# Patient Record
Sex: Male | Born: 1990 | Race: White | Hispanic: No | Marital: Single | Smoking: Current every day smoker
Health system: Southern US, Community
[De-identification: ages and names within clinical notes are randomized; demographics above are authoritative.]

## PROBLEM LIST (undated history)

## (undated) DIAGNOSIS — T4145XA Adverse effect of unspecified anesthetic, initial encounter: Secondary | ICD-10-CM

## (undated) DIAGNOSIS — T8859XA Other complications of anesthesia, initial encounter: Secondary | ICD-10-CM

## (undated) HISTORY — PX: CLAVICLE SURGERY: SHX598

## (undated) HISTORY — PX: HERNIA REPAIR: SHX51

## (undated) HISTORY — PX: APPENDECTOMY: SHX54

---
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
## 2019-03-04 ENCOUNTER — Other Ambulatory Visit: Payer: Self-pay

## 2019-03-04 ENCOUNTER — Emergency Department: Payer: Self-pay

## 2019-03-04 ENCOUNTER — Inpatient Hospital Stay
Admission: EM | Admit: 2019-03-04 | Discharge: 2019-03-08 | DRG: 571 | Disposition: A | Discharge status: Home / Self Care | Payer: Self-pay | Attending: Internal Medicine | Admitting: Internal Medicine

## 2019-03-04 DIAGNOSIS — Z09 Encounter for follow-up examination after completed treatment for conditions other than malignant neoplasm: Secondary | ICD-10-CM

## 2019-03-04 DIAGNOSIS — Z20828 Contact with and (suspected) exposure to other viral communicable diseases: Secondary | ICD-10-CM | POA: Diagnosis present

## 2019-03-04 DIAGNOSIS — F17200 Nicotine dependence, unspecified, uncomplicated: Secondary | ICD-10-CM | POA: Diagnosis present

## 2019-03-04 DIAGNOSIS — L02612 Cutaneous abscess of left foot: Secondary | ICD-10-CM | POA: Diagnosis present

## 2019-03-04 DIAGNOSIS — L03116 Cellulitis of left lower limb: Principal | ICD-10-CM | POA: Diagnosis present

## 2019-03-04 DIAGNOSIS — R51 Headache: Secondary | ICD-10-CM | POA: Diagnosis present

## 2019-03-04 DIAGNOSIS — Z8614 Personal history of Methicillin resistant Staphylococcus aureus infection: Secondary | ICD-10-CM

## 2019-03-04 DIAGNOSIS — L03032 Cellulitis of left toe: Secondary | ICD-10-CM | POA: Diagnosis present

## 2019-03-04 HISTORY — DX: Other complications of anesthesia, initial encounter: T88.59XA

## 2019-03-04 LAB — COMPREHENSIVE METABOLIC PANEL
ALT: 25 U/L (ref 0–44)
AST: 20 U/L (ref 15–41)
Albumin: 3.8 g/dL (ref 3.5–5.0)
Alkaline Phosphatase: 81 U/L (ref 38–126)
Anion gap: 7 (ref 5–15)
BUN: 12 mg/dL (ref 6–20)
CO2: 27 mmol/L (ref 22–32)
Calcium: 9.3 mg/dL (ref 8.9–10.3)
Chloride: 105 mmol/L (ref 98–111)
Creatinine, Ser: 0.92 mg/dL (ref 0.61–1.24)
GFR calc Af Amer: >60 mL/min (ref >60)
GFR calc non Af Amer: >60 mL/min (ref >60)
Glucose, Bld: 114 mg/dL — ABNORMAL HIGH (ref 70–99)
Potassium: 4.1 mmol/L (ref 3.5–5.1)
Sodium: 139 mmol/L (ref 135–145)
Total Bilirubin: 0.6 mg/dL (ref 0.3–1.2)
Total Protein: 7.2 g/dL (ref 6.5–8.1)

## 2019-03-04 LAB — CBC WITH DIFFERENTIAL/PLATELET
Abs Immature Granulocytes: 0.06 10*3/uL (ref 0.00–0.07)
Basophils Absolute: 0.1 10*3/uL (ref 0.0–0.1)
Basophils Relative: 1 %
Eosinophils Absolute: 0.2 10*3/uL (ref 0.0–0.5)
Eosinophils Relative: 2 %
HCT: 40.3 % (ref 39.0–52.0)
Hemoglobin: 13.8 g/dL (ref 13.0–17.0)
Immature Granulocytes: 1 %
Lymphocytes Relative: 16 %
Lymphs Abs: 1.7 10*3/uL (ref 0.7–4.0)
MCH: 30.3 pg (ref 26.0–34.0)
MCHC: 34.2 g/dL (ref 30.0–36.0)
MCV: 88.4 fL (ref 80.0–100.0)
Monocytes Absolute: 1 10*3/uL (ref 0.1–1.0)
Monocytes Relative: 9 %
Neutro Abs: 7.8 10*3/uL — ABNORMAL HIGH (ref 1.7–7.7)
Neutrophils Relative %: 71 %
Platelets: 210 10*3/uL (ref 150–400)
RBC: 4.56 MIL/uL (ref 4.22–5.81)
RDW: 12.5 % (ref 11.5–15.5)
WBC: 10.7 10*3/uL — ABNORMAL HIGH (ref 4.0–10.5)

## 2019-03-04 LAB — LACTIC ACID, PLASMA: Lactic Acid, Venous: 1.3 mmol/L (ref 0.5–1.9)

## 2019-03-04 LAB — SEDIMENTATION RATE: Sed Rate: 12 mm/hr (ref 0–15)

## 2019-03-04 MED ORDER — SODIUM CHLORIDE 0.9 % IV SOLN: 2.0000 g | Freq: Once | INTRAVENOUS | Status: DC

## 2019-03-04 MED ORDER — HYDROMORPHONE HCL 1 MG/ML IJ SOLN
1.0000 mg | Freq: Once | INTRAMUSCULAR | Status: AC
  Administered 2019-03-05: 1 mg via INTRAVENOUS
  Filled 2019-03-04: qty 1

## 2019-03-04 MED ORDER — CLINDAMYCIN PHOSPHATE 600 MG/50ML IV SOLN
600.0000 mg | Freq: Once | INTRAVENOUS | Status: AC
  Administered 2019-03-05: 600 mg via INTRAVENOUS
  Filled 2019-03-04: qty 50

## 2019-03-04 MED ORDER — ONDANSETRON HCL 4 MG/2ML IJ SOLN
4.0000 mg | Freq: Once | INTRAMUSCULAR | Status: AC
  Administered 2019-03-05: 4 mg via INTRAVENOUS
  Filled 2019-03-04: qty 2

## 2019-03-04 MED ORDER — VANCOMYCIN HCL 10 G IV SOLR
1750.0000 mg | Freq: Once | INTRAVENOUS | Status: AC
  Administered 2019-03-05: 1750 mg via INTRAVENOUS
  Filled 2019-03-04: qty 1750

## 2019-03-04 NOTE — ED Triage Notes (Signed)
Pt with left sided lateral foot pain and swelling. Pt states he may have stepped on something and gotten something in foot. Pt with area approx size of quarter noted to lateral foot with dark grey purulent area noted. Area around grey area is red and swollen.

## 2019-03-04 NOTE — ED Triage Notes (Signed)
Pt arrives to ED via POV with c/o left ankle/foot swelling x5 days. Pt states it started off sore with progressively getting worse over the last few days. Pt denies any known injury or trauma. Pt presents with reddened left foot and a small blackened area just proximal to the little toe. No c/o fever; no N/V; pt denies being diabetic. CMS intact.

## 2019-03-04 NOTE — ED Notes (Signed)
This Rn introduced self to pt. Pt states he was at [employer] and doesn't recall on stepping on anything. Pt states his foot slowly

## 2019-03-04 NOTE — ED Provider Notes (Signed)
[facility] Emergency Department Provider Note  ____________________________________________   First MD Initiated Contact with Patient 03/04/19 2237     (approximate)  I have reviewed the triage vital signs and the nursing notes.   HISTORY  Chief Complaint Foot Swelling    HPI Rickey [name] is a 28 y.o. male here with left foot pain and swelling.  [place]  He states that he believes he stepped on something.  He had shoes on throughout most of the day, however.  He states that he noticed some pain to the lateral aspect of his left foot.  There is no wound or drainage.  Since then, he said progressive worsening redness, drainage, and initially a dark, and increasingly black and painful ulcer on the lateral aspect of his foot.  He said associated chills.  No known fevers.  He is felt generally fatigued.  He states the pain has gotten progressively worse and is now unable to walk due to this pain.  No alleviating factors.  No history of previous injuries.  He believes his tetanus is up-to-date, though he does not necessarily recall his last shot.        History reviewed. No pertinent past medical history.  Patient Active Problem List   Diagnosis Date Noted   Cellulitis of left lower extremity 03/05/2019    Past Surgical History:  Procedure Laterality Date   APPENDECTOMY     CLAVICLE SURGERY     HERNIA REPAIR      Prior to Admission medications   Not on File    Allergies Patient has no known allergies.  No family history on file.  Social History Social History   Tobacco Use   Smoking status: Current Every Day Smoker   Smokeless tobacco: Never Used  Substance Use Topics   Alcohol use: Not on file   Drug use: Not on file    Review of Systems  Review of Systems  Constitutional: Positive for chills and fatigue. Negative for fever.  HENT: Negative for sore throat.   Respiratory: Negative for  shortness of breath.   Cardiovascular: Negative for chest pain.  Gastrointestinal: Negative for abdominal pain.  Genitourinary: Negative for flank pain.  Musculoskeletal: Positive for arthralgias and gait problem. Negative for neck pain.  Skin: Positive for wound. Negative for rash.  Allergic/Immunologic: Negative for immunocompromised state.  Neurological: Negative for weakness and numbness.  Hematological: Does not bruise/bleed easily.  All other systems reviewed and are negative.    ____________________________________________  PHYSICAL EXAM:      VITAL SIGNS: ED Triage Vitals  Enc Vitals Group     BP 03/04/19 2207 (!) 129/91     Pulse Rate 03/04/19 2207 (!) 129     Resp 03/04/19 2207 18     Temp 03/04/19 2207 98.6 F (37 C)     Temp Source 03/04/19 2207 Oral     SpO2 03/04/19 2207 98 %     Weight 03/04/19 2208 162 lb (73.5 kg)     Height 03/04/19 2208 5\' 6"  (1.676 m)     Head Circumference --      Peak Flow --      Pain Score 03/04/19 2219 5     Pain Loc --      Pain Edu? --      Excl. in GC? --      Physical Exam Vitals signs and nursing note reviewed.  Constitutional:  General: He is not in acute distress.    Appearance: He is well-developed.     Comments: Appears uncomfortable  HENT:     Head: Normocephalic and atraumatic.  Eyes:     Conjunctiva/sclera: Conjunctivae normal.  Neck:     Musculoskeletal: Neck supple.  Cardiovascular:     Rate and Rhythm: Regular rhythm. Tachycardia present.     Heart sounds: Normal heart sounds. No murmur. No friction rub.  Pulmonary:     Effort: Pulmonary effort is normal. No respiratory distress.     Breath sounds: Normal breath sounds. No wheezing or rales.  Abdominal:     General: There is no distension.     Palpations: Abdomen is soft.     Tenderness: There is no abdominal tenderness.  Skin:    General: Skin is warm.     Capillary Refill: Capillary refill takes less than 2 seconds.  Neurological:     Mental  Status: He is alert and oriented to person, place, and time.     Motor: No abnormal muscle tone.       ____________________________________________   LABS (all labs ordered are listed, but only abnormal results are displayed)  Labs Reviewed  CBC WITH DIFFERENTIAL/PLATELET - Abnormal; Notable for the following components:      Result Value   WBC 10.7 (*)    Neutro Abs 7.8 (*)    All other components within normal limits  COMPREHENSIVE METABOLIC PANEL - Abnormal; Notable for the following components:   Glucose, Bld 114 (*)    All other components within normal limits  CULTURE, BLOOD (ROUTINE X 2)  CULTURE, BLOOD (ROUTINE X 2)  SARS CORONAVIRUS 2 (HOSPITAL ORDER, [place] [facility])  SEDIMENTATION RATE  LACTIC ACID, PLASMA  LACTIC ACID, PLASMA  C-REACTIVE PROTEIN    ____________________________________________  EKG: None ________________________________________  RADIOLOGY All imaging, including plain films, CT scans, and ultrasounds, independently reviewed by me, and interpretations confirmed via formal radiology reads.  ED MD interpretation:   XR Foot: Soft tissue edema, no bony destruction  Official radiology report(s): Dg Foot Complete Left  Result Date: 03/04/2019 CLINICAL DATA:  Foot swelling, redness and area of necrosis EXAM: LEFT FOOT - COMPLETE 3+ VIEW COMPARISON:  None. FINDINGS: Focal soft tissue thickening is noted along the lateral aspect of the fifth metatarsal head. No radiographically evident ulceration. No subjacent osseous erosion, periostitis or destructive change to suggest early radiographic features of osteomyelitis. No subcutaneous gas or foreign body. No acute fracture or traumatic malalignment. Corticated os trigonum is noted posteriorly IMPRESSION: Focal soft tissue thickening along the lateral aspect of the fifth metatarsal head. No radiographic evidence of osteomyelitis. No subcutaneous gas or foreign body. Electronically Signed    By: Lovena Le M.D.   On: 03/04/2019 23:12    ____________________________________________  PROCEDURES   Procedure(s) performed (including Critical Care):  .Critical Care Performed by: Duffy Bruce, MD Authorized by: Duffy Bruce, MD   Critical care provider statement:    Critical care time (minutes):  35   Critical care time was exclusive of:  Separately billable procedures and treating other patients and teaching time   Critical care was necessary to treat or prevent imminent or life-threatening deterioration of the following conditions:  Cardiac failure, circulatory failure and sepsis   Critical care was time spent personally by me on the following activities:  Development of treatment plan with patient or surrogate, discussions with consultants, evaluation of patient's response to treatment, examination of patient, obtaining history  from patient or surrogate, ordering and performing treatments and interventions, ordering and review of laboratory studies, ordering and review of radiographic studies, pulse oximetry, re-evaluation of patient's condition and review of old charts   I assumed direction of critical care for this patient from another provider in my specialty: no      ____________________________________________  INITIAL IMPRESSION / MDM / ASSESSMENT AND PLAN / ED COURSE  As part of my medical decision making, I reviewed the following data within the electronic MEDICAL RECORD NUMBER Notes from prior ED visits and [place] Controlled Substance Database      *Rickey [name] was evaluated in Emergency Department on 03/05/2019 for the symptoms described in the history of present illness. He was evaluated in the context of the global COVID-19 pandemic, which necessitated consideration that the patient might be at risk for infection with the SARS-CoV-2 virus that causes COVID-19. Institutional protocols and algorithms that pertain to the evaluation of patients at risk for COVID-19  are in a state of rapid change based on information released by regulatory bodies including the CDC and federal and state organizations. These policies and algorithms were followed during the patient's care in the ED.  Some ED evaluations and interventions may be delayed as a result of limited staffing during the pandemic.*      Medical Decision Making: 28 year old male here for left foot wound and redness.  Concern for foreign body versus abscess versus vibrio or necrotizing infection, though he systemically appears well.  Discussed with Dr. Excell SeltzerBaker, who recommends MRI and will see the patient as consult.  Broad-spectrum antibiotics given with consultation of pharmacy.  Blood culture sent.  Admit to medicine.  ____________________________________________  FINAL CLINICAL IMPRESSION(S) / ED DIAGNOSES  Final diagnoses:  Cellulitis and abscess of toe of left foot     MEDICATIONS GIVEN DURING THIS VISIT:  Medications  vancomycin (VANCOCIN) 1,750 mg in sodium chloride 0.9 % 500 mL IVPB (has no administration in time range)  ceFEPIme (MAXIPIME) 2 g in sodium chloride 0.9 % 100 mL IVPB (has no administration in time range)  gadobutrol (GADAVIST) 1 MMOL/ML injection 7 mL (has no administration in time range)  clindamycin (CLEOCIN) IVPB 600 mg (0 mg Intravenous Stopped 03/05/19 0043)  HYDROmorphone (DILAUDID) injection 1 mg (1 mg Intravenous Given 03/05/19 0008)  ondansetron (ZOFRAN) injection 4 mg (4 mg Intravenous Given 03/05/19 0006)     ED Discharge Orders    None       Note:  This document was prepared using Dragon voice recognition software and may include unintentional dictation errors.   Shaune PollackIsaacs, Analee Montee, MD 03/05/19 [phone]

## 2019-03-05 ENCOUNTER — Emergency Department: Payer: Self-pay

## 2019-03-05 DIAGNOSIS — L03116 Cellulitis of left lower limb: Secondary | ICD-10-CM | POA: Diagnosis present

## 2019-03-05 LAB — BASIC METABOLIC PANEL
Anion gap: 11 (ref 5–15)
BUN: 13 mg/dL (ref 6–20)
CO2: 25 mmol/L (ref 22–32)
Calcium: 8.7 mg/dL — ABNORMAL LOW (ref 8.9–10.3)
Chloride: 103 mmol/L (ref 98–111)
Creatinine, Ser: 0.87 mg/dL (ref 0.61–1.24)
GFR calc Af Amer: >60 mL/min (ref >60)
GFR calc non Af Amer: >60 mL/min (ref >60)
Glucose, Bld: 105 mg/dL — ABNORMAL HIGH (ref 70–99)
Potassium: 4.1 mmol/L (ref 3.5–5.1)
Sodium: 139 mmol/L (ref 135–145)

## 2019-03-05 LAB — CBC
HCT: 38.3 % — ABNORMAL LOW (ref 39.0–52.0)
Hemoglobin: 12.9 g/dL — ABNORMAL LOW (ref 13.0–17.0)
MCH: 30.1 pg (ref 26.0–34.0)
MCHC: 33.7 g/dL (ref 30.0–36.0)
MCV: 89.3 fL (ref 80.0–100.0)
Platelets: 209 10*3/uL (ref 150–400)
RBC: 4.29 MIL/uL (ref 4.22–5.81)
RDW: 12.6 % (ref 11.5–15.5)
WBC: 11.3 10*3/uL — ABNORMAL HIGH (ref 4.0–10.5)
nRBC: 0 % (ref 0.0–0.2)

## 2019-03-05 LAB — SARS CORONAVIRUS 2 BY RT PCR (HOSPITAL ORDER, PERFORMED IN ~~LOC~~ HOSPITAL LAB): SARS Coronavirus 2: NEGATIVE

## 2019-03-05 LAB — C-REACTIVE PROTEIN: CRP: 1.4 mg/dL — ABNORMAL HIGH (ref <1.0)

## 2019-03-05 LAB — LACTIC ACID, PLASMA: Lactic Acid, Venous: 1 mmol/L (ref 0.5–1.9)

## 2019-03-05 MED ORDER — HYDROCODONE-ACETAMINOPHEN 5-325 MG PO TABS
1.0000 | ORAL_TABLET | Freq: Four times a day (QID) | ORAL | Status: DC | PRN
  Administered 2019-03-05 – 2019-03-06 (×2): 1 via ORAL
  Filled 2019-03-05 (×2): qty 1

## 2019-03-05 MED ORDER — ENSURE PRE-SURGERY PO LIQD
296.0000 mL | Freq: Once | ORAL | Status: DC
  Filled 2019-03-05: qty 296

## 2019-03-05 MED ORDER — ACETAMINOPHEN 325 MG PO TABS: 650.0000 mg | ORAL_TABLET | Freq: Four times a day (QID) | ORAL | Status: DC | PRN

## 2019-03-05 MED ORDER — ONDANSETRON HCL 4 MG PO TABS: 4.0000 mg | ORAL_TABLET | Freq: Four times a day (QID) | ORAL | Status: DC | PRN

## 2019-03-05 MED ORDER — SODIUM CHLORIDE 0.9 % IV SOLN
INTRAVENOUS | Status: DC
  Administered 2019-03-05 – 2019-03-07 (×3): via INTRAVENOUS

## 2019-03-05 MED ORDER — VANCOMYCIN HCL 1.25 G IV SOLR
1250.0000 mg | Freq: Two times a day (BID) | INTRAVENOUS | Status: DC
  Administered 2019-03-05 – 2019-03-08 (×6): 1250 mg via INTRAVENOUS
  Filled 2019-03-05 (×9): qty 1250

## 2019-03-05 MED ORDER — ACETAMINOPHEN 650 MG RE SUPP: 650.0000 mg | Freq: Four times a day (QID) | RECTAL | Status: DC | PRN

## 2019-03-05 MED ORDER — CHLORHEXIDINE GLUCONATE 4 % EX LIQD
60.0000 mL | Freq: Once | CUTANEOUS | Status: AC
  Administered 2019-03-06: 4 via TOPICAL

## 2019-03-05 MED ORDER — KETOROLAC TROMETHAMINE 15 MG/ML IJ SOLN: 15.0000 mg | Freq: Four times a day (QID) | INTRAMUSCULAR | Status: DC

## 2019-03-05 MED ORDER — PIPERACILLIN-TAZOBACTAM 3.375 G IVPB 30 MIN: 3.3750 g | Freq: Four times a day (QID) | INTRAVENOUS | Status: DC

## 2019-03-05 MED ORDER — PIPERACILLIN-TAZOBACTAM 3.375 G IVPB
3.3750 g | Freq: Three times a day (TID) | INTRAVENOUS | Status: DC
  Administered 2019-03-05 – 2019-03-06 (×5): 3.375 g via INTRAVENOUS
  Filled 2019-03-05 (×4): qty 50

## 2019-03-05 MED ORDER — GADOBUTROL 1 MMOL/ML IV SOLN
7.0000 mL | Freq: Once | INTRAVENOUS | Status: AC | PRN
  Administered 2019-03-05: 7 mL via INTRAVENOUS

## 2019-03-05 MED ORDER — NICOTINE 21 MG/24HR TD PT24
21.0000 mg | MEDICATED_PATCH | Freq: Every day | TRANSDERMAL | Status: DC
  Administered 2019-03-05: 21 mg via TRANSDERMAL
  Filled 2019-03-05 (×2): qty 1

## 2019-03-05 MED ORDER — ENOXAPARIN SODIUM 40 MG/0.4ML ~~LOC~~ SOLN
40.0000 mg | SUBCUTANEOUS | Status: DC
  Administered 2019-03-05: 40 mg via SUBCUTANEOUS
  Filled 2019-03-05 (×2): qty 0.4

## 2019-03-05 MED ORDER — KETOROLAC TROMETHAMINE 15 MG/ML IJ SOLN
15.0000 mg | Freq: Four times a day (QID) | INTRAMUSCULAR | Status: AC | PRN
  Administered 2019-03-05 – 2019-03-06 (×2): 15 mg via INTRAVENOUS
  Filled 2019-03-05 (×2): qty 1

## 2019-03-05 MED ORDER — TRAZODONE HCL 50 MG PO TABS: 25.0000 mg | ORAL_TABLET | Freq: Every evening | ORAL | Status: DC | PRN

## 2019-03-05 MED ORDER — ONDANSETRON HCL 4 MG/2ML IJ SOLN: 4.0000 mg | Freq: Four times a day (QID) | INTRAMUSCULAR | Status: DC | PRN

## 2019-03-05 NOTE — ED Notes (Signed)
Pt to MRI

## 2019-03-05 NOTE — Consult Note (Addendum)
PODIATRY / FOOT AND ANKLE SURGERY CONSULTATION NOTE  Requesting Physician: Dr. Eileen Stanford. Rickey [name]  Reason for consult: Left foot abscess  Chief Complaint: Left foot pain/redness and swelling   HPI: Rickey [name] is a 28 y.o. male who presents with pain to the plantar lateral aspect of the foot with redness and swelling associated.  Patient states that about a week ago he was at the beach and thinks that he stepped on something.  His foot became red, hot, and swollen around 5 days ago.  He thought that it would get better on its own but it continues to worsen and cause some more some worse pain.  Patient states that he cannot put much weight on the left foot due to pain at this time.  Patient denies nausea, vomiting, fever, chills.  He was admitted for further work-up and has an MRI that was performed and has been getting IV antibiotics since being admitted.  PMHx: History reviewed. No pertinent past medical history.  Surgical Hx:  Past Surgical History:  Procedure Laterality Date  . APPENDECTOMY    . CLAVICLE SURGERY    . HERNIA REPAIR      FHx: No family history on file.  Social History:  reports that he has been smoking. He has never used smokeless tobacco. No history on file for alcohol and drug.  Allergies: No Known Allergies  Review of Systems: General ROS: negative Psychological ROS: negative Allergy and Immunology ROS: negative Hematological and Lymphatic ROS: negative Respiratory ROS: no cough, shortness of breath, or wheezing Cardiovascular ROS: no chest pain or dyspnea on exertion Gastrointestinal ROS: no abdominal pain, change in bowel habits, or black or bloody stools Musculoskeletal ROS: positive for - joint pain, joint swelling and pain in foot - left Neurological ROS: negative Dermatological ROS: positive for Fluctuant mass plantar lateral fifth MPJ.  No medications prior to admission.    Physical Exam: General: Alert and oriented.  No apparent  distress.  Vascular: DP/PT pulses palpable bilateral, CFT intact to digits bilateral, hair growth to digits bilateral.  Moderate erythema and edema to the left lateral foot with extension dorsally does not appear to go to the ankle.  Neuro: Epicritic sensation intact to digits bilateral.  Derm: No open ulcerations present but palpable fluctuance noted at the left plantar fifth metatarsal phalangeal joint with seropurulent type hematoma present.  MSK: Pain on palpation left foot with maximal tenderness around the fifth MPJ.  Deferred further musculoskeletal exam to the left foot due to pain.  5 out of 5 strength to right lower extremity.  Results for orders placed or performed during the hospital encounter of 03/04/19 (from the past 48 hour(s))  CBC with Differential     Status: Abnormal   Collection Time: 03/04/19 10:38 PM  Result Value Ref Range   WBC 10.7 (H) 4.0 - 10.5 K/uL   RBC 4.56 4.22 - 5.81 MIL/uL   Hemoglobin 13.8 13.0 - 17.0 g/dL   HCT 40.3 39.0 - 52.0 %   MCV 88.4 80.0 - 100.0 fL   MCH 30.3 26.0 - 34.0 pg   MCHC 34.2 30.0 - 36.0 g/dL   RDW 12.5 11.5 - 15.5 %   Platelets 210 150 - 400 K/uL   Neutrophils Relative % 71 %   Neutro Abs 7.8 (H) 1.7 - 7.7 K/uL   Lymphocytes Relative 16 %   Lymphs Abs 1.7 0.7 - 4.0 K/uL   Monocytes Relative 9 %   Monocytes Absolute 1.0 0.1 - 1.0 K/uL  Eosinophils Relative 2 %   Eosinophils Absolute 0.2 0.0 - 0.5 K/uL   Basophils Relative 1 %   Basophils Absolute 0.1 0.0 - 0.1 K/uL   Immature Granulocytes 1 %   Abs Immature Granulocytes 0.06 0.00 - 0.07 K/uL    Comment: Performed at [facility], [address] [place], [place] [address]  Comprehensive metabolic panel     Status: Abnormal   Collection Time: 03/04/19 10:38 PM  Result Value Ref Range   Sodium 139 135 - 145 mmol/L   Potassium 4.1 3.5 - 5.1 mmol/L   Chloride 105 98 - 111 mmol/L   CO2 27 22 - 32 mmol/L   Glucose, Bld 114 (H) 70 - 99 mg/dL   BUN 12 6 - 20 mg/dL    Creatinine, Ser 6.040.92 0.61 - 1.24 mg/dL   Calcium 9.3 8.9 - 54.010.3 mg/dL   Total Protein 7.2 6.5 - 8.1 g/dL   Albumin 3.8 3.5 - 5.0 g/dL   AST 20 15 - 41 U/L   ALT 25 0 - 44 U/L   Alkaline Phosphatase 81 38 - 126 U/L   Total Bilirubin 0.6 0.3 - 1.2 mg/dL   GFR calc non Af Amer >60 >60 mL/min   GFR calc Af Amer >60 >60 mL/min   Anion gap 7 5 - 15    Comment: Performed at [facility], [address] [place], [place] [address]  Sedimentation rate     Status: None   Collection Time: 03/04/19 10:38 PM  Result Value Ref Range   Sed Rate 12 0 - 15 mm/hr    Comment: Performed at [facility], [address] [place], [place] [address]  Lactic acid, plasma     Status: None   Collection Time: 03/04/19 10:38 PM  Result Value Ref Range   Lactic Acid, Venous 1.3 0.5 - 1.9 mmol/L    Comment: Performed at [facility], [address] [place], [place] [address]  Blood culture (routine x 2)     Status: None (Preliminary result)   Collection Time: 03/04/19 10:47 PM   Specimen: BLOOD  Result Value Ref Range   Specimen Description BLOOD RIGHT ANTECUBITAL    Special Requests      BOTTLES DRAWN AEROBIC AND ANAEROBIC Blood Culture results may not be optimal due to an inadequate volume of blood received in culture bottles   Culture      NO GROWTH < 12 HOURS Performed at [facility], [address] [place], [place] [address]    Report Status PENDING   Blood culture (routine x 2)     Status: None (Preliminary result)   Collection Time: 03/04/19 10:47 PM   Specimen: BLOOD  Result Value Ref Range   Specimen Description BLOOD LEFT FOREARM    Special Requests      BOTTLES DRAWN AEROBIC AND ANAEROBIC Blood Culture adequate volume   Culture      NO GROWTH < 12 HOURS Performed at [facility], [address] [place], [place] [address]    Report Status PENDING   Lactic acid, plasma     Status: None   Collection Time: 03/05/19 12:04 AM  Result Value  Ref Range   Lactic Acid, Venous 1.0 0.5 - 1.9 mmol/L    Comment: Performed at [facility], [address] [place], [place] [address]  C-reactive protein     Status: Abnormal   Collection Time: 03/05/19 12:04 AM  Result Value Ref Range  CRP 1.4 (H) <1.0 mg/dL    Comment: Performed at [facility], [address] [place], [place] [address]  SARS Coronavirus 2 [facility] order, Performed in [facility] hospital lab) Nasopharyngeal Nasopharyngeal Swab     Status: None   Collection Time: 03/05/19 12:55 AM   Specimen: Nasopharyngeal Swab  Result Value Ref Range   SARS Coronavirus 2 NEGATIVE NEGATIVE    Comment: (NOTE) If result is NEGATIVE SARS-CoV-2 target nucleic acids are NOT DETECTED. The SARS-CoV-2 RNA is generally detectable in upper and lower  respiratory specimens during the acute phase of infection. The lowest  concentration of SARS-CoV-2 viral copies this assay can detect is 250  copies / mL. A negative result does not preclude SARS-CoV-2 infection  and should not be used as the sole basis for treatment or other  patient management decisions.  A negative result may occur with  improper specimen collection / handling, submission of specimen other  than nasopharyngeal swab, presence of viral mutation(s) within the  areas targeted by this assay, and inadequate number of viral copies  (<250 copies / mL). A negative result must be combined with clinical  observations, patient history, and epidemiological information. If result is POSITIVE SARS-CoV-2 target nucleic acids are DETECTED. The SARS-CoV-2 RNA is generally detectable in upper and lower  respiratory specimens dur ing the acute phase of infection.  Positive  results are indicative of active infection with SARS-CoV-2.  Clinical  correlation with patient history and other diagnostic information is  necessary to determine patient infection status.  Positive results do  not rule out bacterial infection or  co-infection with other viruses. If result is PRESUMPTIVE POSTIVE SARS-CoV-2 nucleic acids MAY BE PRESENT.   A presumptive positive result was obtained on the submitted specimen  and confirmed on repeat testing.  While 2019 novel coronavirus  (SARS-CoV-2) nucleic acids may be present in the submitted sample  additional confirmatory testing may be necessary for epidemiological  and / or clinical management purposes  to differentiate between  SARS-CoV-2 and other Sarbecovirus currently known to infect humans.  If clinically indicated additional testing with an alternate test  methodology [phone]) is advised. The SARS-CoV-2 RNA is generally  detectable in upper and lower respiratory sp ecimens during the acute  phase of infection. The expected result is Negative. Fact Sheet for Patients:  BoilerBrush.com.cy Fact Sheet for Healthcare Providers: https://pope.com/ This test is not yet approved or cleared by the [place] FDA and has been authorized for detection and/or diagnosis of SARS-CoV-2 by FDA under an Emergency Use Authorization (EUA).  This EUA will remain in effect (meaning this test can be used) for the duration of the COVID-19 declaration under Section 564(b)(1) of the Act, 21 U.S.C. section 360bbb-3(b)(1), unless the authorization is terminated or revoked sooner. Performed at [facility], [address] [place], [place] [address]   Basic metabolic panel     Status: Abnormal   Collection Time: 03/05/19  4:07 AM  Result Value Ref Range   Sodium 139 135 - 145 mmol/L   Potassium 4.1 3.5 - 5.1 mmol/L   Chloride 103 98 - 111 mmol/L   CO2 25 22 - 32 mmol/L   Glucose, Bld 105 (H) 70 - 99 mg/dL   BUN 13 6 - 20 mg/dL   Creatinine, Ser 5.46 0.61 - 1.24 mg/dL   Calcium 8.7 (L) 8.9 - 10.3 mg/dL   GFR calc non Af Amer >60 >60 mL/min   GFR calc Af Amer >60 >60  mL/min   Anion gap 11 5 - 15    Comment: Performed at  [facility], [address] [place], [place] [address]  CBC     Status: Abnormal   Collection Time: 03/05/19  4:07 AM  Result Value Ref Range   WBC 11.3 (H) 4.0 - 10.5 K/uL   RBC 4.29 4.22 - 5.81 MIL/uL   Hemoglobin 12.9 (L) 13.0 - 17.0 g/dL   HCT 60.4 (L) 54.0 - 98.1 %   MCV 89.3 80.0 - 100.0 fL   MCH 30.1 26.0 - 34.0 pg   MCHC 33.7 30.0 - 36.0 g/dL   RDW 19.1 47.8 - 29.5 %   Platelets 209 150 - 400 K/uL   nRBC 0.0 0.0 - 0.2 %    Comment: Performed at [facility], [address] [place], [place] [address]   Mr Foot Left W Wo Contrast  Result Date: 03/05/2019 CLINICAL DATA:  Pain, swelling and inflammation around the fifth digit. EXAM: MRI OF THE LEFT FOREFOOT WITHOUT AND WITH CONTRAST TECHNIQUE: Multiplanar, multisequence MR imaging of the left foot was performed both before and after administration of intravenous contrast. CONTRAST:  7mL GADAVIST GADOBUTROL 1 MMOL/ML IV SOLN COMPARISON:  Radiographs 03/04/2019 FINDINGS: There is a focal skin blister along the lateral aspect of the forefoot at the level of the fifth metatarsal head. Significant surrounding subcutaneous soft tissue swelling/edema/fluid consistent with cellulitis. This is mainly along the dorsum and lateral aspect of the foot. Just deep to the blister there is a 11 mm subcutaneous abscess. No MR findings to suggest septic arthritis or osteomyelitis. No myofasciitis or pyomyositis. IMPRESSION: 1. 11 mm subcutaneous abscess along the lateral aspect of the fifth metatarsal head with significant surrounding cellulitis extending into the dorsum of the foot. 2. No MR findings suspicious for septic arthritis or osteomyelitis. Electronically Signed   By: Rudie Meyer M.D.   On: 03/05/2019 09:11   Dg Foot Complete Left  Result Date: 03/04/2019 CLINICAL DATA:  Foot swelling, redness and area of necrosis EXAM: LEFT FOOT - COMPLETE 3+ VIEW COMPARISON:  None. FINDINGS: Focal soft tissue thickening is noted along the  lateral aspect of the fifth metatarsal head. No radiographically evident ulceration. No subjacent osseous erosion, periostitis or destructive change to suggest early radiographic features of osteomyelitis. No subcutaneous gas or foreign body. No acute fracture or traumatic malalignment. Corticated os trigonum is noted posteriorly IMPRESSION: Focal soft tissue thickening along the lateral aspect of the fifth metatarsal head. No radiographic evidence of osteomyelitis. No subcutaneous gas or foreign body. Electronically Signed   By: Kreg Shropshire M.D.   On: 03/04/2019 23:12    Blood pressure 128/79, pulse 68, temperature 98.1 F (36.7 C), temperature source Oral, resp. rate 20, height  (1.676 m), weight 70.8 kg, SpO2 99 %.   Assessment 1. Left foot abscess fifth metatarsal phalangeal joint 2. Cellulitis left foot 2/2 abscess 3. Tobacco abuse  Plan -Examine both feet. -X-ray and MRI imaging reviewed.  Abscess noted to the left plantar lateral fifth metatarsal phalangeal joint area.  No signs of osteomyelitis present. -MRI imaging matches clinical examination for abscess. -PROCEDURE: Performed small percutaneous stab incision after betadine prep to the area of the L 5th MTPJ plantolateral foot in area of abscess with 15 blade after verbal consent obtained.  Drained 3cc seroanginous/purulence - culture taken and sent off.  Dressed with betadine gauze, 4x4, kerlix, ace wrap. -Discussed with patient all treatment options of both conservative and surgical attempts at  correction further.  Once the benefits and complications of surgical intervention were discussed the patient has elected for surgery consisting of further left foot incision and drainage. -Patient currently on regular diet and states that he last ate something about 3 hours ago but has been taking sips of water and is drinking upon entrance of the room.  Patient to be n.p.o. at midnight for surgery on 03/06/2019. Plan for surgery around  12:00pm on 03/06/19. -Partial weightbearing with heel contact to the left foot in surgical shoe. -Appreciate recommendations for pain medication and for antibiotic broad-spectrum therapy at this time.  Will obtain culture results and surgery to narrow spectrum of antibiotics to be placed on for discharge.  Rosetta Posner 03/05/2019, 9:27 AM

## 2019-03-05 NOTE — ED Notes (Signed)
Patient transported to MRI 

## 2019-03-05 NOTE — H&P (Addendum)
[facility] [place] NAME: Opie Maclaughlin    MR#:  [id number]  DATE OF BIRTH:  1990-08-25  DATE OF ADMISSION:  03/04/2019  PRIMARY CARE PHYSICIAN: Patient, No Pcp Per   REQUESTING/REFERRING PHYSICIAN: Duffy Bruce, MD  CHIEF COMPLAINT:   Chief Complaint  Patient presents with  . Foot Swelling    HISTORY OF PRESENT ILLNESS:  Marvens Hollars  is a 28 y.o. Caucasian male with a known history of tobacco abuse, who presented to the emergency room with acute onset of worsening left foot pain that started about 5 days ago with associated erythema and swelling and radiating pain to the left leg.  He was at the beach prior to that but does not recall any injuries.  His pain has been becoming throbbing before he presented to the ER.  He denied any fever or chills.  He admits to occasional headache without dizziness or blurred vision.  No nausea vomiting or abdominal pain.  No chest pain or dyspnea or cough or wheezing.  No bleeding diathesis.  Upon presentation to the emergency room, vital signs were within normal.  Labs revealed unremarkable CMP and a CBC showed WBC of 10.7.  Sed rate was 12.  COVID-19 test is currently pending.  Left foot x-ray showed focal soft tissue thickening along the lateral aspect of the fifth metatarsal head with no radiographic evidence for osteomyelitis and no subcutaneous gas or foreign body.  On-call podiatrist was notified and recommended MRI that is currently pending.  The patient was given IV clindamycin, 4 mg of IV Zofran and 1 mg of IV Dilaudid.  He will be admitted to a medical bed for further evaluation and management. PAST MEDICAL HISTORY:  Ongoing tobacco abuse  PAST SURGICAL HISTORY:   Past Surgical History:  Procedure Laterality Date  . APPENDECTOMY    . CLAVICLE SURGERY    . HERNIA REPAIR      SOCIAL HISTORY:   Social History   Tobacco Use  . Smoking status: Current Every Day Smoker  . Smokeless  tobacco: Never Used  Substance Use Topics  . Alcohol use: Not on file    FAMILY HISTORY:  Positive for cancer.  DRUG ALLERGIES:  No Known Allergies  REVIEW OF SYSTEMS:   ROS As per history of present illness. All pertinent systems were reviewed above. Constitutional,  HEENT, cardiovascular, respiratory, GI, GU, musculoskeletal, neuro, psychiatric, endocrine,  integumentary and hematologic systems were reviewed and are otherwise  negative/unremarkable except for positive findings mentioned above in the HPI.   MEDICATIONS AT HOME:   Prior to Admission medications   Not on File      VITAL SIGNS:  Blood pressure 137/77, pulse 70, temperature 98.5 F (36.9 C), temperature source Oral, resp. rate 16, height 5\' 6"  (1.676 m), weight 73.5 kg, SpO2 96 %.  PHYSICAL EXAMINATION:  Physical Exam  GENERAL:  28 y.o.-year-old Caucasian male patient lying in the bed with no acute distress.  EYES: Pupils equal, round, reactive to light and accommodation. No scleral icterus. Extraocular muscles intact.  HEENT: Head atraumatic, normocephalic. Oropharynx and nasopharynx clear.  NECK:  Supple, no jugular venous distention. No thyroid enlargement, no tenderness.  LUNGS: Normal breath sounds bilaterally, no wheezing, rales,rhonchi or crepitation. No use of accessory muscles of respiration.  CARDIOVASCULAR: Regular rate and rhythm, S1, S2 normal. No murmurs, rubs, or gallops.  ABDOMEN: Soft, nondistended, nontender. Bowel sounds present. No organomegaly or mass.  EXTREMITIES/skin: The patient has left  lateral foot erythema with associated likely fluctuating d lateral aspect of the fifth metatarsal head significant tenderness and warmth.  No cyanosis, or clubbing.  NEUROLOGIC: Cranial nerves II through XII are intact. Muscle strength 5/5 in all extremities. Sensation intact. Gait not checked.  PSYCHIATRIC: The patient is alert and oriented x 3.  Normal affect and good eye contact. SKIN: As above  otherwise no other rashes lesions or ulcers. LABORATORY PANEL:   CBC Recent Labs  Lab 03/04/19 2238  WBC 10.7*  HGB 13.8  HCT 40.3  PLT 210   ------------------------------------------------------------------------------------------------------------------  Chemistries  Recent Labs  Lab 03/04/19 2238  NA 139  K 4.1  CL 105  CO2 27  GLUCOSE 114*  BUN 12  CREATININE 0.92  CALCIUM 9.3  AST 20  ALT 25  ALKPHOS 81  BILITOT 0.6   ------------------------------------------------------------------------------------------------------------------  Cardiac Enzymes No results for input(s): TROPONINI in the last 168 hours. ------------------------------------------------------------------------------------------------------------------  RADIOLOGY:  Dg Foot Complete Left  Result Date: 03/04/2019 CLINICAL DATA:  Foot swelling, redness and area of necrosis EXAM: LEFT FOOT - COMPLETE 3+ VIEW COMPARISON:  None. FINDINGS: Focal soft tissue thickening is noted along the lateral aspect of the fifth metatarsal head. No radiographically evident ulceration. No subjacent osseous erosion, periostitis or destructive change to suggest early radiographic features of osteomyelitis. No subcutaneous gas or foreign body. No acute fracture or traumatic malalignment. Corticated os trigonum is noted posteriorly IMPRESSION: Focal soft tissue thickening along the lateral aspect of the fifth metatarsal head. No radiographic evidence of osteomyelitis. No subcutaneous gas or foreign body. Electronically Signed   By: Kreg Shropshire M.D.   On: 03/04/2019 23:12      IMPRESSION AND PLAN:   1.  Left foot cellulitis and suspected abscess of the skin over the fifth metatarsal head. -The patient will be admitted to a medical bed. -He will be continued on IV antibiotic therapy with vancomycin and Zosyn. -MRI is currently pending. -Will obtain a podiatry consultation as mentioned above.  The patient be kept n.p.o.   His COVID-19 test is currently pending. -I notified Dr. Excell Seltzer of the consult.  2.  Ongoing tobacco abuse. - I counseled him for smoking cessation and he will receive further counseling here.  3.  DVT prophylaxis. -Subcutaneous Lovenox.    All the records are reviewed and case discussed with ED provider. The plan of care was discussed in details with the patient (and family). I answered all questions. The patient agreed to proceed with the above mentioned plan. Further management will depend upon hospital course.   CODE STATUS: Full code  TOTAL TIME TAKING CARE OF THIS PATIENT: 45 minutes.    Hannah Beat M.D on 03/05/2019 at 12:48 AM  Pager - [phone]  After 6pm go to [email or web address] - Social research officer, government  [facility]  Office  [phone]  CC: Primary care physician; Patient, No Pcp Per   Note: This dictation was prepared with Dragon dictation along with smaller phrase technology. Any transcriptional errors that result from this process are unintentional.

## 2019-03-05 NOTE — Progress Notes (Signed)
PHARMACY -  BRIEF ANTIBIOTIC NOTE   Pharmacy has received consult(s) for vanc/cefepime from an ED provider.  The patient's profile has been reviewed for ht/wt/allergies/indication/available labs.    One time order(s) placed for vanc/cefepime  Further antibiotics/pharmacy consults should be ordered by admitting physician if indicated.                       Thank you,  Tobie Lords, PharmD, BCPS Clinical Pharmacist 03/05/2019  12:06 AM

## 2019-03-05 NOTE — Progress Notes (Signed)
Admitted this morning for left foot cellulitis, MRI confirmed abscess, seen by podiatry, for incision and drainage tomorrow.  Continue aggressive IV antibiotics.  Patient had bedside incision done, cultures taken out.

## 2019-03-05 NOTE — ED Notes (Signed)
Dr. Mansy at bedside. 

## 2019-03-05 NOTE — Progress Notes (Signed)
Pharmacy Antibiotic Note  Rickey [name] is a 28 y.o. male admitted on 03/04/2019 with cellulitis/worsening foot wound s/t stepping on something while at the beach.  Pharmacy has been consulted for vanc/zosyn dosing.  Plan: Patient received vanc 1.75g IV load and clindamycin 600 mg IV x 1 in ED  Vancomycin 1250 mg IV Q 12 hrs. Goal AUC 400-550. Expected AUC: 518.0 SCr used: 0.92 Cssmin: 13.4  Will continue zosyn 3.375g IV q8h and will continue to monitor s/sx infx and adjust doses as necessary.  Height: 5\' 6"  (167.6 cm) Weight: 156 lb (70.8 kg) IBW/kg (Calculated) : 63.8  Temp (24hrs), Avg:98.4 F (36.9 C), Min:98.1 F (36.7 C), Max:98.6 F (37 C)  Recent Labs  Lab 03/04/19 2238 03/05/19 0004  WBC 10.7*  --   CREATININE 0.92  --   LATICACIDVEN 1.3 1.0    Estimated Creatinine Clearance: 108.8 mL/min (by C-G formula based on SCr of 0.92 mg/dL).    No Known Allergies  Thank you for allowing pharmacy to be a part of this patient's care.  Tobie Lords, PharmD, BCPS Clinical Pharmacist 03/05/2019 4:21 AM

## 2019-03-05 NOTE — ED Notes (Signed)
ED TO INPATIENT HANDOFF REPORT  ED Nurse Name and Phone #: Torrie Mayers [phone]  S Name/Age/Gender Rickey [name] 28 y.o. male Room/Bed: ED19A/ED19A  Code Status   Code Status: Not on file  Home/SNF/Other Home Patient oriented to: self, place, time and situation Is this baseline? Yes   Triage Complete: Triage complete  Chief Complaint L Ankle Pain  Triage Note Pt with left sided lateral foot pain and swelling. Pt states he may have stepped on something and gotten something in foot. Pt with area approx size of quarter noted to lateral foot with dark grey purulent area noted. Area around grey area is red and swollen.   Pt arrives to ED via POV with c/o left ankle/foot swelling x5 days. Pt states it started off sore with progressively getting worse over the last few days. Pt denies any known injury or trauma. Pt presents with reddened left foot and a small blackened area just proximal to the little toe. No c/o fever; no N/V; pt denies being diabetic. CMS intact.   Allergies No Known Allergies  Level of Care/Admitting Diagnosis ED Disposition    ED Disposition Condition [facility] Area: [place] [100120]  Level of Care: Med-Surg [16]  Covid Evaluation: Asymptomatic Screening Protocol (No Symptoms)  Diagnosis: Cellulitis of left lower extremity [010932]  Admitting Physician: Christel Mormon [[id number]]  Attending Physician: Christel Mormon [[id number]]  Estimated length of stay: past midnight tomorrow  Certification:: I certify this patient will need inpatient services for at least 2 midnights  PT Class (Do Not Modify): Inpatient [101]  PT Acc Code (Do Not Modify): Private [1]       B Medical/Surgery History History reviewed. No pertinent past medical history. Past Surgical History:  Procedure Laterality Date  . APPENDECTOMY    . CLAVICLE SURGERY    . HERNIA REPAIR       A IV Location/Drains/Wounds Patient Lines/Drains/Airways  Status   Active Line/Drains/Airways    Name:   Placement date:   Placement time:   Site:   Days:   Peripheral IV 03/05/19 Right Antecubital   03/05/19    0002    Antecubital   less than 1   Peripheral IV 03/05/19 Left Forearm   03/05/19    0003    Forearm   less than 1          Intake/Output Last 24 hours No intake or output data in the 24 hours ending 03/05/19 0121  Labs/Imaging Results for orders placed or performed during the hospital encounter of 03/04/19 (from the past 48 hour(s))  CBC with Differential     Status: Abnormal   Collection Time: 03/04/19 10:38 PM  Result Value Ref Range   WBC 10.7 (H) 4.0 - 10.5 K/uL   RBC 4.56 4.22 - 5.81 MIL/uL   Hemoglobin 13.8 13.0 - 17.0 g/dL   HCT 40.3 39.0 - 52.0 %   MCV 88.4 80.0 - 100.0 fL   MCH 30.3 26.0 - 34.0 pg   MCHC 34.2 30.0 - 36.0 g/dL   RDW 12.5 11.5 - 15.5 %   Platelets 210 150 - 400 K/uL   Neutrophils Relative % 71 %   Neutro Abs 7.8 (H) 1.7 - 7.7 K/uL   Lymphocytes Relative 16 %   Lymphs Abs 1.7 0.7 - 4.0 K/uL   Monocytes Relative 9 %   Monocytes Absolute 1.0 0.1 - 1.0 K/uL   Eosinophils Relative 2 %   Eosinophils Absolute 0.2  0.0 - 0.5 K/uL   Basophils Relative 1 %   Basophils Absolute 0.1 0.0 - 0.1 K/uL   Immature Granulocytes 1 %   Abs Immature Granulocytes 0.06 0.00 - 0.07 K/uL    Comment: Performed at [facility], [address] [place], [place] [address]  Comprehensive metabolic panel     Status: Abnormal   Collection Time: 03/04/19 10:38 PM  Result Value Ref Range   Sodium 139 135 - 145 mmol/L   Potassium 4.1 3.5 - 5.1 mmol/L   Chloride 105 98 - 111 mmol/L   CO2 27 22 - 32 mmol/L   Glucose, Bld 114 (H) 70 - 99 mg/dL   BUN 12 6 - 20 mg/dL   Creatinine, Ser 2.70 0.61 - 1.24 mg/dL   Calcium 9.3 8.9 - 62.3 mg/dL   Total Protein 7.2 6.5 - 8.1 g/dL   Albumin 3.8 3.5 - 5.0 g/dL   AST 20 15 - 41 U/L   ALT 25 0 - 44 U/L   Alkaline Phosphatase 81 38 - 126 U/L   Total Bilirubin 0.6 0.3 - 1.2 mg/dL    GFR calc non Af Amer >60 >60 mL/min   GFR calc Af Amer >60 >60 mL/min   Anion gap 7 5 - 15    Comment: Performed at [facility], [address] [place], [place] [address]  Sedimentation rate     Status: None   Collection Time: 03/04/19 10:38 PM  Result Value Ref Range   Sed Rate 12 0 - 15 mm/hr    Comment: Performed at [facility], [address] [place], [place] [address]  Lactic acid, plasma     Status: None   Collection Time: 03/04/19 10:38 PM  Result Value Ref Range   Lactic Acid, Venous 1.3 0.5 - 1.9 mmol/L    Comment: Performed at [facility], [address] [place], [place] [address]  Lactic acid, plasma     Status: None   Collection Time: 03/05/19 12:04 AM  Result Value Ref Range   Lactic Acid, Venous 1.0 0.5 - 1.9 mmol/L    Comment: Performed at [facility], [address] [place], [place] [address]   Dg Foot Complete Left  Result Date: 03/04/2019 CLINICAL DATA:  Foot swelling, redness and area of necrosis EXAM: LEFT FOOT - COMPLETE 3+ VIEW COMPARISON:  None. FINDINGS: Focal soft tissue thickening is noted along the lateral aspect of the fifth metatarsal head. No radiographically evident ulceration. No subjacent osseous erosion, periostitis or destructive change to suggest early radiographic features of osteomyelitis. No subcutaneous gas or foreign body. No acute fracture or traumatic malalignment. Corticated os trigonum is noted posteriorly IMPRESSION: Focal soft tissue thickening along the lateral aspect of the fifth metatarsal head. No radiographic evidence of osteomyelitis. No subcutaneous gas or foreign body. Electronically Signed   By: Kreg Shropshire M.D.   On: 03/04/2019 23:12    Pending Labs Unresulted Labs (From admission, onward)    Start     Ordered   03/05/19 0045  SARS Coronavirus 2 [facility] order, Performed in [facility] hospital lab) Nasopharyngeal Nasopharyngeal Swab  (Symptomatic/High Risk of Exposure/Tier 1  Patients Labs with Precautions)  Once,   STAT    Question Answer Comment  Is this test for diagnosis or screening Screening   Symptomatic for COVID-19 as defined by CDC No   Hospitalized for COVID-19 No   Admitted to ICU for COVID-19 No   Previously tested for COVID-19 No  Resident in a congregate (group) care setting Unknown   Employed in healthcare setting Unknown      03/05/19 0045   03/04/19 2323  C-reactive protein  Once,   STAT     03/04/19 2322   03/04/19 2223  Blood culture (routine x 2)  BLOOD CULTURE X 2,   STAT     03/04/19 2223   Signed and Held  HIV Antibody  (Routine Testing)  Once,   R     Signed and Held   Signed and Held  Basic metabolic panel  Tomorrow morning,   R     Signed and Held   Signed and Held  CBC  Tomorrow morning,   R     Signed and Held          Vitals/Pain Today's Vitals   03/04/19 2330 03/05/19 0014 03/05/19 0030 03/05/19 0042  BP: 123/63 (!) 140/58 137/77   Pulse: 74 73 70   Resp: 19 (!) 21 16   Temp:  98.5 F (36.9 C)    TempSrc:  Oral    SpO2: 97% (!) 18% 96%   Weight:      Height:      PainSc:  8   4     Isolation Precautions No active isolations  Medications Medications  vancomycin (VANCOCIN) 1,750 mg in sodium chloride 0.9 % 500 mL IVPB (has no administration in time range)  ceFEPIme (MAXIPIME) 2 g in sodium chloride 0.9 % 100 mL IVPB (has no administration in time range)  gadobutrol (GADAVIST) 1 MMOL/ML injection 7 mL (has no administration in time range)  clindamycin (CLEOCIN) IVPB 600 mg (0 mg Intravenous Stopped 03/05/19 0043)  HYDROmorphone (DILAUDID) injection 1 mg (1 mg Intravenous Given 03/05/19 0008)  ondansetron (ZOFRAN) injection 4 mg (4 mg Intravenous Given 03/05/19 0006)    Mobility walks Low fall risk   Focused Assessments Skin   R Recommendations: See Admitting Provider Note  Report given to:   Additional Notes:

## 2019-03-06 ENCOUNTER — Inpatient Hospital Stay: Payer: Self-pay

## 2019-03-06 ENCOUNTER — Inpatient Hospital Stay: Payer: Self-pay | Admitting: Anesthesiology

## 2019-03-06 ENCOUNTER — Encounter
Admission: EM | Disposition: A | Discharge status: Home / Self Care | Payer: Self-pay | Source: Home / Self Care | Attending: Internal Medicine

## 2019-03-06 ENCOUNTER — Other Ambulatory Visit: Payer: Self-pay

## 2019-03-06 HISTORY — PX: INCISION AND DRAINAGE: SHX5863

## 2019-03-06 LAB — CREATININE, SERUM
Creatinine, Ser: 1 mg/dL (ref 0.61–1.24)
GFR calc Af Amer: >60 mL/min (ref >60)
GFR calc non Af Amer: >60 mL/min (ref >60)

## 2019-03-06 LAB — HIV ANTIBODY (ROUTINE TESTING W REFLEX): HIV Screen 4th Generation wRfx: NONREACTIVE

## 2019-03-06 SURGERY — INCISION AND DRAINAGE
Anesthesia: General | Laterality: Left

## 2019-03-06 MED ORDER — KETAMINE HCL 50 MG/ML IJ SOLN
INTRAMUSCULAR | Status: DC | PRN
  Administered 2019-03-06: 50 mg via INTRAMUSCULAR

## 2019-03-06 MED ORDER — DEXAMETHASONE SODIUM PHOSPHATE 10 MG/ML IJ SOLN
INTRAMUSCULAR | Status: AC
  Filled 2019-03-06: qty 1

## 2019-03-06 MED ORDER — SEVOFLURANE IN SOLN
RESPIRATORY_TRACT | Status: AC
  Filled 2019-03-06: qty 250

## 2019-03-06 MED ORDER — DEXAMETHASONE SODIUM PHOSPHATE 10 MG/ML IJ SOLN
INTRAMUSCULAR | Status: DC | PRN
  Administered 2019-03-06: 5 mg via INTRAVENOUS

## 2019-03-06 MED ORDER — LIDOCAINE HCL (CARDIAC) PF 100 MG/5ML IV SOSY
PREFILLED_SYRINGE | INTRAVENOUS | Status: DC | PRN
  Administered 2019-03-06: 100 mg via INTRAVENOUS

## 2019-03-06 MED ORDER — HYDROCODONE-ACETAMINOPHEN 5-325 MG PO TABS
1.0000 | ORAL_TABLET | Freq: Four times a day (QID) | ORAL | Status: DC | PRN
  Administered 2019-03-06 – 2019-03-07 (×2): 2 via ORAL
  Filled 2019-03-06 (×2): qty 2

## 2019-03-06 MED ORDER — FENTANYL CITRATE (PF) 100 MCG/2ML IJ SOLN
INTRAMUSCULAR | Status: AC
  Filled 2019-03-06: qty 2

## 2019-03-06 MED ORDER — MIDAZOLAM HCL 2 MG/2ML IJ SOLN
INTRAMUSCULAR | Status: AC
  Filled 2019-03-06: qty 2

## 2019-03-06 MED ORDER — ONDANSETRON HCL 4 MG/2ML IJ SOLN
INTRAMUSCULAR | Status: DC | PRN
  Administered 2019-03-06: 4 mg via INTRAVENOUS

## 2019-03-06 MED ORDER — BUPIVACAINE HCL (PF) 0.5 % IJ SOLN
INTRAMUSCULAR | Status: DC | PRN
  Administered 2019-03-06: 20 mL

## 2019-03-06 MED ORDER — ONDANSETRON HCL 4 MG/2ML IJ SOLN
INTRAMUSCULAR | Status: AC
  Filled 2019-03-06: qty 2

## 2019-03-06 MED ORDER — LIDOCAINE HCL (PF) 1 % IJ SOLN
INTRAMUSCULAR | Status: DC | PRN
  Administered 2019-03-06: 30 mL

## 2019-03-06 MED ORDER — PROPOFOL 10 MG/ML IV BOLUS
INTRAVENOUS | Status: AC
  Filled 2019-03-06: qty 20

## 2019-03-06 MED ORDER — MIDAZOLAM HCL 2 MG/2ML IJ SOLN
INTRAMUSCULAR | Status: DC | PRN
  Administered 2019-03-06: 2 mg via INTRAVENOUS

## 2019-03-06 MED ORDER — PROPOFOL 10 MG/ML IV BOLUS
INTRAVENOUS | Status: DC | PRN
  Administered 2019-03-06: 200 mg via INTRAVENOUS

## 2019-03-06 MED ORDER — FENTANYL CITRATE (PF) 100 MCG/2ML IJ SOLN
INTRAMUSCULAR | Status: DC | PRN
  Administered 2019-03-06 (×2): 50 ug via INTRAVENOUS

## 2019-03-06 SURGICAL SUPPLY — 58 items
BAG COUNTER SPONGE EZ (MISCELLANEOUS) ×2 IMPLANT
BLADE OSC/SAGITTAL MD 5.5X18 (BLADE) IMPLANT
BLADE OSCILLATING/SAGITTAL (BLADE)
BLADE SW THK.38XMED LNG THN (BLADE) IMPLANT
BNDG CONFORM 2 STRL LF (GAUZE/BANDAGES/DRESSINGS) IMPLANT
BNDG CONFORM 3 STRL LF (GAUZE/BANDAGES/DRESSINGS) IMPLANT
BNDG ELASTIC 3X5.8 VLCR NS LF (GAUZE/BANDAGES/DRESSINGS) IMPLANT
BNDG ELASTIC 4X5.8 VLCR NS LF (GAUZE/BANDAGES/DRESSINGS) IMPLANT
BNDG ESMARK 4X12 TAN STRL LF (GAUZE/BANDAGES/DRESSINGS) ×2 IMPLANT
BNDG GAUZE 4.5X4.1 6PLY STRL (MISCELLANEOUS) ×2 IMPLANT
CANISTER SUCT 1200ML W/VALVE (MISCELLANEOUS) ×2 IMPLANT
CANISTER SUCT 3000ML PPV (MISCELLANEOUS) IMPLANT
COVER WAND RF STERILE (DRAPES) ×2 IMPLANT
CUFF TOURN SGL QUICK 12 (TOURNIQUET CUFF) IMPLANT
CUFF TOURN SGL QUICK 18X4 (TOURNIQUET CUFF) IMPLANT
DRAPE FLUOR MINI C-ARM 54X84 (DRAPES) IMPLANT
DURAPREP 26ML APPLICATOR (WOUND CARE) ×2 IMPLANT
ELECT REM PT RETURN 9FT ADLT (ELECTROSURGICAL) ×2
ELECTRODE REM PT RTRN 9FT ADLT (ELECTROSURGICAL) ×1 IMPLANT
GAUZE PACKING 1/4 X5 YD (GAUZE/BANDAGES/DRESSINGS) IMPLANT
GAUZE PACKING IODOFORM 1X5 (MISCELLANEOUS) IMPLANT
GAUZE SPONGE 4X4 12PLY STRL (GAUZE/BANDAGES/DRESSINGS) ×2 IMPLANT
GAUZE XEROFORM 1X8 LF (GAUZE/BANDAGES/DRESSINGS) ×2 IMPLANT
GLOVE BIO SURGEON STRL SZ7 (GLOVE) ×2 IMPLANT
GLOVE INDICATOR 7.0 STRL GRN (GLOVE) ×2 IMPLANT
GOWN STRL REUS W/ TWL LRG LVL3 (GOWN DISPOSABLE) ×2 IMPLANT
GOWN STRL REUS W/TWL LRG LVL3 (GOWN DISPOSABLE) ×2
HANDPIECE VERSAJET DEBRIDEMENT (MISCELLANEOUS) IMPLANT
IV NS 1000ML (IV SOLUTION)
IV NS 1000ML BAXH (IV SOLUTION) IMPLANT
KIT TURNOVER KIT A (KITS) ×2 IMPLANT
LABEL OR SOLS (LABEL) IMPLANT
NDL FILTER BLUNT 18X1 1/2 (NEEDLE) ×1 IMPLANT
NDL HYPO 25X1 1.5 SAFETY (NEEDLE) ×2 IMPLANT
NEEDLE FILTER BLUNT 18X 1/2SAF (NEEDLE) ×1
NEEDLE FILTER BLUNT 18X1 1/2 (NEEDLE) ×1 IMPLANT
NEEDLE HYPO 25X1 1.5 SAFETY (NEEDLE) ×4 IMPLANT
NS IRRIG 500ML POUR BTL (IV SOLUTION) ×2 IMPLANT
PACK EXTREMITY ARMC (MISCELLANEOUS) ×2 IMPLANT
PAD ABD DERMACEA PRESS 5X9 (GAUZE/BANDAGES/DRESSINGS) ×2 IMPLANT
PULSAVAC PLUS IRRIG FAN TIP (DISPOSABLE) ×2
RASP SM TEAR CROSS CUT (RASP) IMPLANT
SOL .9 NS 3000ML IRR  AL (IV SOLUTION)
SOL .9 NS 3000ML IRR UROMATIC (IV SOLUTION) IMPLANT
SOL PREP PVP 2OZ (MISCELLANEOUS)
SOLUTION PREP PVP 2OZ (MISCELLANEOUS) IMPLANT
STOCKINETTE STRL 6IN 960660 (GAUZE/BANDAGES/DRESSINGS) ×2 IMPLANT
SUT ETHILON 3-0 FS-10 30 BLK (SUTURE) ×2
SUT ETHILON 4-0 (SUTURE)
SUT ETHILON 4-0 FS2 18XMFL BLK (SUTURE)
SUT VIC AB 3-0 SH 27 (SUTURE) ×1
SUT VIC AB 3-0 SH 27X BRD (SUTURE) IMPLANT
SUT VIC AB 4-0 FS2 27 (SUTURE) IMPLANT
SUTURE EHLN 3-0 FS-10 30 BLK (SUTURE) IMPLANT
SUTURE ETHLN 4-0 FS2 18XMF BLK (SUTURE) IMPLANT
SWAB CULTURE AMIES ANAERIB BLU (MISCELLANEOUS) IMPLANT
SYR 10ML LL (SYRINGE) ×2 IMPLANT
TIP FAN IRRIG PULSAVAC PLUS (DISPOSABLE) IMPLANT

## 2019-03-06 NOTE — Evaluation (Signed)
Physical Therapy Evaluation Patient Details Name: Rickey [name] MRN: [id number] DOB: [birth year] Today's Date: 03/06/2019   History of Present Illness  Pt is a 28 yo male diagnosed with left foot abscess and cellulitis and is s/p left foot I&D to the level of subcutaneous tissue.   Pt is a current smoker.    Clinical Impression  Pt presented with min deficits in transfers and gait but overall performed very well during the session.  Pt was Ind with bed mobility tasks and SBA with transfers with training provided on proper sequencing during transfers for WB compliance.  Pt was able to amb 30' including making 180 deg turns x 2 with SBA and min verbal cues for general sequencing for safety and WB compliance.  Pt was able to ascend and descend 2 stairs twice with CGA and min-mod verbal cues for sequencing.  Gait and stair training preceded by combination of verbal cues and visual demonstration for proper sequencing for safety and WB compliance.  Pt remained compliant with LLE NWB status throughout the session and did not require to place the left heel down for support/balance.  Pt showed good carryover of proper techniques during the session with the amount of cuing needed for safety quickly decreasing as the session progressed.  Pt will benefit from continued PT services while in acute care to prevent functional decline but will not require additional skilled PT services once discharged.          Follow Up Recommendations No PT follow up    Equipment Recommendations  Rolling walker with 5" wheels;3in1 (PT)    Recommendations for Other Services       Precautions / Restrictions Precautions Precautions: None Required Braces or Orthoses: Other Brace Other Brace: Heel wedge post-op shoe to the L foot with mobility Restrictions Weight Bearing Restrictions: Yes LLE Weight Bearing: Non weight bearing Other Position/Activity Restrictions: Per surgeon, limit amb to functional distances only. OK for pt  to put left heel down with wedge shoe donned as needed during transfers and ambulation but attempt to stay NWB.      Mobility  Bed Mobility Overal bed mobility: Independent                Transfers Overall transfer level: Needs assistance Equipment used: Rolling walker (2 wheeled) Transfers: Sit to/from Stand Sit to Stand: Supervision         General transfer comment: Min verbal cues for sequencing for LLE NWB compliance  Ambulation/Gait Ambulation/Gait assistance: Supervision Gait Distance (Feet): 30 Feet Assistive device: Rolling walker (2 wheeled)   Gait velocity: decreased   General Gait Details: Hop-to gait with Min verbal and visual cues for sequencing for LLE NWB compliance.  Pt able to maintain NWB to the LLE throughout the session.  Stairs Stairs: Yes Stairs assistance: Min guard Stair Management: No rails;With walker;Forwards;Backwards Number of Stairs: 2 General stair comments: Ascend/descend 2 steps x 2 with a RW with min-mod verbal cues as well as visual demonstration for proper sequencing.  Wheelchair Mobility    Modified Rankin (Stroke Patients Only)       Balance Overall balance assessment: No apparent balance deficits (not formally assessed)                                           Pertinent Vitals/Pain Pain Assessment: 0-10 Pain Score: 7  Pain Location: L foot Pain Descriptors /  Indicators: Sore;Aching Pain Intervention(s): Premedicated before session;Monitored during session    [place] expects to be discharged to:: Private residence Living Arrangements: Non-relatives/Friends Available Help at Discharge: Friend(s);Available 24 hours/day Type of Home: House Home Access: Stairs to enter Entrance Stairs-Rails: None Entrance Stairs-Number of Steps: 2 Home Layout: One level Home Equipment: None      Prior Function Level of Independence: Independent         Comments: 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     Hand Dominance        Extremity/Trunk Assessment   Upper Extremity Assessment Upper Extremity Assessment: Overall WFL for tasks assessed    Lower Extremity Assessment Lower Extremity Assessment: Overall WFL for tasks assessed       Communication   Communication: No difficulties  Cognition Arousal/Alertness: Awake/alert Behavior During Therapy: WFL for tasks assessed/performed Overall Cognitive Status: Within Functional Limits for tasks assessed                                        General Comments      Exercises Other Exercises Other Exercises: Gait and stair training with combination of verbal cues and visual demonstration followed by pt practice with verbal cues for proper sequencing for safety and WB compliance.   Assessment/Plan    PT Assessment Patient needs continued PT services  PT Problem List Decreased knowledge of use of DME;Decreased knowledge of precautions       PT Treatment Interventions DME instruction;Gait training;Stair training;Therapeutic activities;Therapeutic exercise;Patient/family education    PT Goals (Current goals can be found in the Care Plan section)  Acute Rehab PT Goals Patient Stated Goal: To walk safely PT Goal Formulation: With patient Time For Goal Achievement: 03/19/19 Potential to Achieve Goals: Good    Frequency Min 2X/week   Barriers to discharge        Co-evaluation               AM-PAC PT "6 Clicks" Mobility  Outcome Measure Help needed turning from your back to your side while in a flat bed without using bedrails?: None Help needed moving from lying on your back to sitting on the side of a flat bed without using bedrails?: None Help needed moving to and from a bed to a chair (including a wheelchair)?: A Little Help needed standing up from a chair using your arms (e.g., wheelchair or  bedside chair)?: A Little Help needed to walk in hospital room?: A Little Help needed climbing 3-5 steps with a railing? : A Little 6 Click Score: 20    End of Session Equipment Utilized During Treatment: Gait belt Activity Tolerance: Patient tolerated treatment well Patient left: in chair;with call bell/phone within reach;with chair alarm set Nurse Communication: Mobility status PT Visit Diagnosis: Difficulty in walking, not elsewhere classified (R26.2)    Time: 8469-6295 PT Time Calculation (min) (ACUTE ONLY): 37 min   Charges:   PT Evaluation $PT Eval Low Complexity: 1 Low PT Treatments $Gait Training: 8-22 mins        D. Royetta Asal PT, DPT 03/06/19, 5:55 PM

## 2019-03-06 NOTE — Progress Notes (Signed)
[place] [facility] [place] NAME: Albertus Chiarelli    MR#:  [id number]  DATE OF BIRTH:  15-Jan-1991  SUBJECTIVE: Patient is seen at bedside, could not water for abscess drainage.  Complains of significant burning, pain in the left foot.  CHIEF COMPLAINT:   Chief Complaint  Patient presents with  . Foot Swelling    REVIEW OF SYSTEMS:   ROS CONSTITUTIONAL: No fever, fatigue or weakness.  EYES: No blurred or double vision.  EARS, NOSE, AND THROAT: No tinnitus or ear pain.  RESPIRATORY: No cough, shortness of breath, wheezing or hemoptysis.  CARDIOVASCULAR: No chest pain, orthopnea, edema.  GASTROINTESTINAL: No nausea, vomiting, diarrhea or abdominal pain.  GENITOURINARY: No dysuria, hematuria.  ENDOCRINE: No polyuria, nocturia,  HEMATOLOGY: No anemia, easy bruising or bleeding SKIN: No rash or lesion. MUSCULOSKELETAL: PATIENT HAS A BLACK ESCHAR PRESENT ON THE BOTTOM OF THE LEFT FOOT.  NEUROLOGIC: No tingling, numbness, weakness.  PSYCHIATRY: No anxiety or depression.   DRUG ALLERGIES:  No Known Allergies  VITALS:  Blood pressure (!) 108/57, pulse (!) 57, temperature (!) 97.3 F (36.3 C), resp. rate 12, height 5\' 6"  (1.676 m), weight 70.8 kg, SpO2 100 %.  PHYSICAL EXAMINATION:  GENERAL:  28 y.o.-year-old patient lying in the bed with no acute distress.  EYES: Pupils equal, round, reactive to light and accommodation. No scleral icterus. Extraocular muscles intact.  HEENT: Head atraumatic, normocephalic. Oropharynx and nasopharynx clear.  NECK:  Supple, no jugular venous distention. No thyroid enlargement, no tenderness.  LUNGS: Normal breath sounds bilaterally, no wheezing, rales,rhonchi or crepitation. No use of accessory muscles of respiration.  CARDIOVASCULAR: S1, S2 normal. No murmurs, rubs, or gallops.  ABDOMEN: Soft, nontender, nondistended. Bowel sounds present. No organomegaly or mass.  EXTREMITIES: Left foot plantar aspect area  of tenderness, black eschar present NEUROLOGIC: Cranial nerves II through XII are intact. Muscle strength 5/5 in all extremities. Sensation intact. Gait not checked.  PSYCHIATRIC: The patient is alert and oriented x 3.  SKIN: No obvious rash, lesion, or ulcer.    LABORATORY PANEL:   CBC Recent Labs  Lab 03/05/19 0407  WBC 11.3*  HGB 12.9*  HCT 38.3*  PLT 209   ------------------------------------------------------------------------------------------------------------------  Chemistries  Recent Labs  Lab 03/04/19 2238 03/05/19 0407 03/06/19 0417  NA 139 139  --   K 4.1 4.1  --   CL 105 103  --   CO2 27 25  --   GLUCOSE 114* 105*  --   BUN 12 13  --   CREATININE 0.92 0.87 1.00  CALCIUM 9.3 8.7*  --   AST 20  --   --   ALT 25  --   --   ALKPHOS 81  --   --   BILITOT 0.6  --   --    ------------------------------------------------------------------------------------------------------------------  Cardiac Enzymes No results for input(s): TROPONINI in the last 168 hours. ------------------------------------------------------------------------------------------------------------------  RADIOLOGY:  Mr Foot Left W Wo Contrast  Result Date: 03/05/2019 CLINICAL DATA:  Pain, swelling and inflammation around the fifth digit. EXAM: MRI OF THE LEFT FOREFOOT WITHOUT AND WITH CONTRAST TECHNIQUE: Multiplanar, multisequence MR imaging of the left foot was performed both before and after administration of intravenous contrast. CONTRAST:  3mL GADAVIST GADOBUTROL 1 MMOL/ML IV SOLN COMPARISON:  Radiographs 03/04/2019 FINDINGS: There is a focal skin blister along the lateral aspect of the forefoot at the level of the fifth metatarsal head. Significant surrounding subcutaneous soft tissue swelling/edema/fluid consistent with  cellulitis. This is mainly along the dorsum and lateral aspect of the foot. Just deep to the blister there is a 11 mm subcutaneous abscess. No MR findings to suggest septic  arthritis or osteomyelitis. No myofasciitis or pyomyositis. IMPRESSION: 1. 11 mm subcutaneous abscess along the lateral aspect of the fifth metatarsal head with significant surrounding cellulitis extending into the dorsum of the foot. 2. No MR findings suspicious for septic arthritis or osteomyelitis. Electronically Signed   By: Rudie Meyer M.D.   On: 03/05/2019 09:11   Dg Foot Complete Left  Result Date: 03/04/2019 CLINICAL DATA:  Foot swelling, redness and area of necrosis EXAM: LEFT FOOT - COMPLETE 3+ VIEW COMPARISON:  None. FINDINGS: Focal soft tissue thickening is noted along the lateral aspect of the fifth metatarsal head. No radiographically evident ulceration. No subjacent osseous erosion, periostitis or destructive change to suggest early radiographic features of osteomyelitis. No subcutaneous gas or foreign body. No acute fracture or traumatic malalignment. Corticated os trigonum is noted posteriorly IMPRESSION: Focal soft tissue thickening along the lateral aspect of the fifth metatarsal head. No radiographic evidence of osteomyelitis. No subcutaneous gas or foreign body. Electronically Signed   By: Kreg Shropshire M.D.   On: 03/04/2019 23:12    EKG:  No orders found for this or any previous visit.  ASSESSMENT AND PLAN:   Left foot abscess at fifth MTPJ, status post I&D by podiatry in OR today, continue vancomycin for now, cultures so far are showing moderate staph aureus.  Continue vancomycin, discontinue Zosyn. Had stab incision done for left foot wound by podiatry at bedside yesterday with wound cultures obtained, Wound cultures so far showing staph aureus, blood cultures are negative, ikely discharge home tomorrow pending cultures and sensitivity results and clinical improvement.   All the records are reviewed and case discussed with Care Management/Social Workerr. Management plans discussed with the patient, family and they are in agreement.  CODE STATUS: Full TOTAL TIME TAKING  CARE OF THIS PATIENT: 38 minutes.   POSSIBLE D/C IN 1-2 DAYS, DEPENDING ON CLINICAL CONDITION.   Katha Hamming M.D on 03/06/2019 at 1:01 PM  Between 7am to 6pm - Pager - [phone]  After 6pm go to [email or web address] - password [email or web address]  [facility]  Office  [phone]  CC: Primary care physician; Patient, No Pcp Per   Note: This dictation was prepared with Dragon dictation along with smaller phrase technology. Any transcriptional errors that result from this process are unintentional.

## 2019-03-06 NOTE — Op Note (Signed)
PODIATRY / FOOT AND ANKLE SURGERY OPERATIVE REPORT    SURGEON: Rosetta Posner, DPM  PRE-OPERATIVE DIAGNOSIS:  1.  Abscess left foot 2.  Cellulitis left foot  POST-OPERATIVE DIAGNOSIS: Same  PROCEDURE(S): 1. Incision and drainage left foot to the level of subcutaneous tissue  HEMOSTASIS: Left ankle tourniquet  ANESTHESIA: general  ESTIMATED BLOOD LOSS: 5 cc  FINDING(S): 1.  Abscess left fifth metatarsal phalangeal joint area in the subcutaneous tissue only  PATHOLOGY/SPECIMEN(S): Wound culture left foot abscess  INDICATIONS:   Rickey [name] is a 28 y.o. male who presents with for surgery today due to an abscess at the left plantar lateral fifth metatarsal phalangeal joint within the soft tissues and corresponding cellulitis.  Patient states that he does not recall stepping on anything and denies any drug use at all.  He states that this issue happened about a week ago and is gotten worse ever since knees had increased pain, redness, swelling and notable fluctuance upon admission.  An MRI was performed showing a subcutaneous type abscess with no extension proximally at the level of the fifth metatarsal phalangeal joint with corresponding cellulitis of the soft tissues.  A small stab incision was made in the area of the abscess bedside on 03/05/2019 to drain this area and a culture was taken and sent off.  Discussed with patient at that time need for further incision and drainage with cleanout and closure.  Patient presents today for this procedure consisting of further incision and drainage.  DESCRIPTION: After obtaining full informed written consent, the patient was brought back to the operating room and placed supine upon the operating table.  The patient received IV antibiotics prior to induction.  After obtaining adequate anesthesia, a left fifth ray block was performed with 20 cc of 1% lidocaine plain.  The patient was prepped and draped in the standard fashion.  An Esmarch bandage was  used to exsanguinate the left lower extremity and pneumatic ankle tourniquet was inflated.  Attention was directed to the left plantar lateral fifth metatarsal phalangeal joint area where a linear longitudinal incision was made to the level of the subcutaneous tissues in the area of the abscess that was seen on MRI.  At this time purulence was able to be expressed of approximately 4 cc.  A wound culture was taken at this time passed off the operative site.  There appeared to be a moderate amount of necrotic tissue in this area and all necrotic tissue was resected and passed off the operative site to healthy bleeding margins.  A curette was used to debride the tissues further removing any further necrosis.  The surgical site was then flushed with copious amounts normal sterile saline with the pulse lavage.  A portion of the skin was also excised in the area of the abscess appeared to be necrotic.  This was passed off the operative site.  Further debridement was performed of all necrotic and nonviable tissues which were passed off in the operative site.  The surgical site again was flushed with copious amounts normal sterile saline.  At this time subcutaneous skin closure was reapproximated well coapted with 3-0 Vicryl and the skin was then reapproximated well coapted with 3-0 nylon horizontal mattress type stitching.  The plantar soft tissues were advanced/rotated [name] dorsally to cover the void in the area of his ulceration and unfortunately a T type of skin closure had to be obtained due to the necrosis present of skin and soft tissues.  Adequate closure was obtained.  An additional 20 cc of half percent Marcaine plain was injected about the operative site.  A postoperative dressing was then applied consisting of Xeroform to the incision site followed by 4 x 4 gauze, ABD, Kerlix, Ace wrap.  The pneumatic ankle tourniquet was deflated and a prompt hyperemic response was noted all digits left foot.  It is  important to note that the patient tolerated the procedure and anesthesia well was transferred to recovery room fossae and stable.  After period of postoperative monitoring the patient be discharged back to the inpatient room with the following written and oral postop instructions: Keep surgical dressings clean, dry, and intact, ice and elevate left lower extremity when at rest, take postop pain medication antibiotics as prescribed, remain nonweightbearing to left lower extremity all times with heel contact for transfers only, PT consult ordered.  We will continue to monitor patient until discharge with appropriate antibiotics for home.    COMPLICATIONS: None  CONDITION: Stable, good  Rickey [name]

## 2019-03-06 NOTE — Anesthesia Post-op Follow-up Note (Signed)
Anesthesia QCDR form completed.        

## 2019-03-06 NOTE — Progress Notes (Signed)
Return to Ortho floor , Alert and oriented, denies pain or discomfort. I D to LLE report from RN , that tolerated well. Xeroform , 4x4 ABD , Kerlix and Ace wrap intact to LLE. BO  137/98-62-100% on RA. Remains on 75cc of NS , Ordered a meal d/t states he is " Starving". Will continue to [facility]

## 2019-03-06 NOTE — Plan of Care (Signed)

## 2019-03-06 NOTE — H&P (Signed)
HISTORY AND PHYSICAL INTERVAL NOTE:  03/06/2019  11:31 AM  Rickey [name]  has presented today for surgery, with the diagnosis of left foot abscess 5th MTPJ.  The various methods of treatment have been discussed with the patient.  No guarantees were given.  After consideration of risks, benefits and other options for treatment, the patient has consented to surgery.  I have reviewed the patients' chart and labs.    PROCEDURE: Left foot incision and drainage 5th MTPJ   A history and physical examination was performed in my office.  The patient was reexamined.  There have been no changes to this history and physical examination.  Caroline More

## 2019-03-06 NOTE — Transfer of Care (Signed)
Immediate Anesthesia Transfer of Care Note  Patient: Rickey [name]  Procedure(s) Performed: INCISION AND DRAINAGE, LEFT FOOT (Left )  Patient Location: PACU  Anesthesia Type:General  Level of Consciousness: drowsy and patient cooperative  Airway & Oxygen Therapy: Patient Spontanous Breathing and Patient connected to face mask oxygen  Post-op Assessment: Report given to RN and Post -op Vital signs reviewed and stable  Post vital signs: Reviewed and stable  Last Vitals:  Vitals Value Taken Time  BP 104/55 03/06/19 1240  Temp 36.3 C 03/06/19 1240  Pulse 57 03/06/19 1243  Resp 13 03/06/19 1243  SpO2 99 % 03/06/19 1243  Vitals shown include unvalidated device data.  Last Pain:  Vitals:   03/06/19 1100  TempSrc: Tympanic  PainSc: 6       Patients Stated Pain Goal: 2 (26/94/85 4627)  Complications: No apparent anesthesia complications

## 2019-03-06 NOTE — Anesthesia Preprocedure Evaluation (Signed)
Anesthesia Evaluation  Patient identified by MRN, date of birth, ID band Patient awake    Reviewed: Allergy & Precautions, H&P , NPO status , Patient's Chart, lab work & pertinent test results, reviewed documented beta blocker date and time   History of Anesthesia Complications (+) history of anesthetic complications (endorses shivering post anesthesia)  Airway Mallampati: II  TM Distance: >3 FB Neck ROM: full    Dental  (+) Poor Dentition, Missing, Dental Advidsory Given   Pulmonary neg shortness of breath, neg COPD, neg recent URI, Current Smoker and Patient abstained from smoking.,    Pulmonary exam normal        Cardiovascular Exercise Tolerance: Good negative cardio ROS Normal cardiovascular exam     Neuro/Psych negative neurological ROS  negative psych ROS   GI/Hepatic negative GI ROS, Neg liver ROS,   Endo/Other  negative endocrine ROS  Renal/GU negative Renal ROS  negative genitourinary   Musculoskeletal   Abdominal   Peds  Hematology negative hematology ROS (+)   Anesthesia Other Findings Past Medical History: No date: Complication of anesthesia  Patient denied drug use and I told him the risks of anesthetic complications with regards to drug use including heart attack, stroke, and death.  Patient again denied the use of illicit drugs.  Reproductive/Obstetrics negative OB ROS                             Anesthesia Physical Anesthesia Plan  ASA: I  Anesthesia Plan: General   Post-op Pain Management:    Induction: Intravenous  PONV Risk Score and Plan: 1 and Ondansetron, Dexamethasone, Midazolam and Treatment may vary due to age or medical condition  Airway Management Planned: LMA  Additional Equipment:   Intra-op Plan:   Post-operative Plan: Extubation in OR  Informed Consent: I have reviewed the patients History and Physical, chart, labs and discussed the  procedure including the risks, benefits and alternatives for the proposed anesthesia with the patient or authorized representative who has indicated his/her understanding and acceptance.     Dental Advisory Given  Plan Discussed with: Anesthesiologist, CRNA and Surgeon  Anesthesia Plan Comments:         Anesthesia Quick Evaluation

## 2019-03-06 NOTE — Anesthesia Postprocedure Evaluation (Signed)
Anesthesia Post Note  Patient: Rickey [name]  Procedure(s) Performed: INCISION AND DRAINAGE, LEFT FOOT (Left )  Patient location during evaluation: PACU Anesthesia Type: General Level of consciousness: awake and alert Pain management: pain level controlled Vital Signs Assessment: post-procedure vital signs reviewed and stable Respiratory status: spontaneous breathing, nonlabored ventilation, respiratory function stable and patient connected to nasal cannula oxygen Cardiovascular status: blood pressure returned to baseline and stable Postop Assessment: no apparent nausea or vomiting Anesthetic complications: no     Last Vitals:  Vitals:   03/06/19 1320 03/06/19 1330  BP: 115/69 122/87  Pulse: 73 71  Resp: 19 14  Temp: 36.4 C   SpO2: 100% 100%    Last Pain:  Vitals:   03/06/19 1330  TempSrc:   PainSc: 0-No pain                 Martha Clan

## 2019-03-06 NOTE — Anesthesia Procedure Notes (Signed)
Procedure Name: LMA Insertion Date/Time: 03/06/2019 11:49 AM Performed by: Jonna Clark, CRNA Pre-anesthesia Checklist: Patient identified, Patient being monitored, Timeout performed, Emergency Drugs available and Suction available Patient Re-evaluated:Patient Re-evaluated prior to induction Oxygen Delivery Method: Circle system utilized Preoxygenation: Pre-oxygenation with 100% oxygen Induction Type: IV induction Ventilation: Mask ventilation without difficulty LMA: LMA inserted LMA Size: 4.0 Tube type: Oral Number of attempts: 1 Placement Confirmation: positive ETCO2 and breath sounds checked- equal and bilateral Tube secured with: Tape Dental Injury: Teeth and Oropharynx as per pre-operative assessment

## 2019-03-07 ENCOUNTER — Encounter: Payer: Self-pay | Admitting: Podiatry

## 2019-03-07 LAB — CREATININE, SERUM
Creatinine, Ser: 0.89 mg/dL (ref 0.61–1.24)
GFR calc Af Amer: >60 mL/min (ref >60)
GFR calc non Af Amer: >60 mL/min (ref >60)

## 2019-03-07 NOTE — Progress Notes (Addendum)
PODIATRY / FOOT AND ANKLE SURGERY PROGRESS NOTE  Reason for consult: L foot abscess  Chief Complaint: L foot infection   HPI: Rickey [name] is a 28 y.o. male who presents with resting in bed status post 1 day left foot incision and drainage with rotational skin closure.  Patient states that he is in mild pain today and only has pain at the level of the surgical site but is fairly well controlled with pain medications currently.  Patient has maintained nonweightbearing since the procedure and has been putting a small amount of weight on the heel when having to get up and move around.  Patient has worked with PT and no further care is needed with PT.  Patient denies nausea, vomiting, fever, and chills.  PMHx:  Past Medical History:  Diagnosis Date  . Complication of anesthesia     Surgical Hx:  Past Surgical History:  Procedure Laterality Date  . APPENDECTOMY    . CLAVICLE SURGERY    . HERNIA REPAIR    . INCISION AND DRAINAGE Left 03/06/2019   Procedure: INCISION AND DRAINAGE, LEFT FOOT;  Surgeon: Caroline More, DPM;  Location: [facility] ORS;  Service: Podiatry;  Laterality: Left;    FHx: History reviewed. No pertinent family history.  Social History:  reports that he has been smoking. He has a 5.00 pack-year smoking history. He has never used smokeless tobacco. He reports current alcohol use. He reports that he does not use drugs.  Allergies: No Known Allergies  Review of Systems: General ROS: negative Psychological ROS: negative Respiratory ROS: no cough, shortness of breath, or wheezing Cardiovascular ROS: no chest pain or dyspnea on exertion Gastrointestinal ROS: no abdominal pain, change in bowel habits, or black or bloody stools Musculoskeletal ROS: positive for - joint pain and joint swelling Neurological ROS: negative Dermatological ROS: positive for Left foot incision/wound  No medications prior to admission.    Physical Exam: General: Alert and oriented.  No apparent  distress.  Vascular: DP/PT pulses palpable bilateral, CFT intact to digits bilateral, hair growth to digits bilateral.  Mild to moderate erythema to the left lower extremity but decreased overall since admission and procedure, overall decrease in intensity and appears to be receding back distally to the area of the fifth metatarsal phalangeal joint, no proximal streaking.  Mild to moderate swelling to the left lower extremity as well but decreased since admission.  Neuro: Light touch sensation intact to digits bilateral.  Derm: Incision site to the left fifth metatarsal phalangeal joint where the abscess was drained appears to be intact with sutures intact, plastics type closure still obtained over defect of soft tissue present with CFT intact.  No dehiscence at this time, mild hemorrhagic changes to the tissues surrounding the area with some dry hematoma present.  No fluctuance palpable today and only serous sanguinous drainage.    MSK: Pain on palpation of the left forefoot  Results for orders placed or performed during the hospital encounter of 03/04/19 (from the past 48 hour(s))  Aerobic/Anaerobic Culture (surgical/deep wound)     Status: None (Preliminary result)   Collection Time: 03/05/19 10:15 AM   Specimen: Wound; Abscess  Result Value Ref Range   Specimen Description      WOUND Performed at [facility], [address] [place], [place] [address]    Special Requests      Normal Performed at [facility], [address] [place], [place] [address]    Gram Stain  NO WBC SEEN FEW GRAM POSITIVE COCCI Performed at [facility], [address] [place], [place] [address]    Culture MODERATE STAPHYLOCOCCUS AUREUS    Report Status PENDING   Creatinine, serum     Status: None   Collection Time: 03/06/19  4:17 AM  Result Value Ref Range   Creatinine, Ser 1.00 0.61 - 1.24 mg/dL   GFR calc non Af Amer >60 >60 mL/min   GFR calc Af Amer >60 >60 mL/min     Comment: Performed at [facility], [address] [place], [place] [address]  Aerobic/Anaerobic Culture (surgical/deep wound)     Status: None (Preliminary result)   Collection Time: 03/06/19 12:01 PM   Specimen: PATH Other; Abscess  Result Value Ref Range   Specimen Description      FOOT LEFT MID METATARSAL ABSCESS Performed at [facility], [address] [place], [place] [address]    Special Requests      NONE Performed at [facility], [address] [place], [place] [address]    Gram Stain      ABUNDANT WBC PRESENT, PREDOMINANTLY PMN FEW GRAM POSITIVE COCCI Performed at [facility], [address] [place], [place] [address]    Culture PENDING    Report Status PENDING   Creatinine, serum     Status: None   Collection Time: 03/07/19  3:56 AM  Result Value Ref Range   Creatinine, Ser 0.89 0.61 - 1.24 mg/dL   GFR calc non Af Amer >60 >60 mL/min   GFR calc Af Amer >60 >60 mL/min    Comment: Performed at [facility], [address] [place], [place] [address]   Dg Foot 2 Views Left  Result Date: 03/06/2019 CLINICAL DATA:  Left foot abscess.  Postoperative exam EXAM: LEFT FOOT - 2 VIEW COMPARISON:  03/05/2019 FINDINGS: There is no evidence of fracture or dislocation. There is no evidence of arthropathy or other focal bone abnormality. Postoperative changes to the lateral soft tissues at the level of the distal fifth metatarsal and fifth proximal phalanx. No radiopaque foreign body. IMPRESSION: 1. No acute osseous abnormality. 2. Postoperative changes to the soft tissues of the lateral foot. Electronically Signed   By: Duanne GuessNicholas  Plundo M.D.   On: 03/06/2019 14:02    Blood pressure 114/77, pulse (!) 54, temperature 97.7 F (36.5 C), temperature source Oral, resp. rate 16, height 5\' 6"  (1.676 m), weight 70.8 kg, SpO2 100 %.   Assessment 1. Left foot cellulitis secondary to abscess fifth metatarsal phalangeal joint status post I&D  date of surgery 03/06/2019  Plan -Examined left foot today and remove dressing. -Incision site appears to be intact with sutures intact, decreased erythema and edema since procedure and admission.  Capillary fill time intact to flaps. -Redressed with Betadine to the incision line followed by Xeroform, 4 x 4's, ABD, Kerlix, Ace wrap. -Keep dressings clean, dry, and intact until postoperative visit. -Patient to still maintain partial weightbearing to nonweightbearing the left lower extremity at all times.  Okay to put small amount of pressure on the heel in surgical shoe.  Patient is going to try to maintain staying off the foot to improve wound healing potential. -Appreciate recommendations for pain medication and antibiotic therapy per medicine. -Intraoperative culture results with gram-positive cocci growth, previous cultures growing staph aureus, sensitivities pending. -Recommend patient stay in the hospital for another day for IV antibiotics as erythema and edema seem to be present still but improving overall  in intensity and then transition to oral antibiotics for 7-10 days based on culture results.  Podiatry team to sign off at this time.  Discharge instructions placed in chart.  Please contact if any questions or instructions are needed.  Recommend that patient likely be discharged tomorrow when medically stable.  Rosetta Posner 03/07/2019, 9:45 AM

## 2019-03-07 NOTE — Progress Notes (Signed)
[place] [facility] [place] NAME: Rickey [name]    MR#:  [id number]  DATE OF BIRTH:  03/08/1991  Dressings changed by podiatry today.  Incision site looks clean.  Afebrile.  CHIEF COMPLAINT:   Chief Complaint  Patient presents with  . Foot Swelling    REVIEW OF SYSTEMS:   ROS CONSTITUTIONAL: No fever, fatigue or weakness.  EYES: No blurred or double vision.  EARS, NOSE, AND THROAT: No tinnitus or ear pain.  RESPIRATORY: No cough, shortness of breath, wheezing or hemoptysis.  CARDIOVASCULAR: No chest pain, orthopnea, edema.  GASTROINTESTINAL: No nausea, vomiting, diarrhea or abdominal pain.  GENITOURINARY: No dysuria, hematuria.  ENDOCRINE: No polyuria, nocturia,  HEMATOLOGY: No anemia, easy bruising or bleeding SKIN: No rash or lesion. MUSCULOSKELETAL: BIG DRESSING PRESENT FOR THE LEFT FOOT.  PSYCHIATRY: No anxiety or depression.   DRUG ALLERGIES:  No Known Allergies  VITALS:  Blood pressure 131/73, pulse 68, temperature 98.2 F (36.8 C), temperature source Oral, resp. rate 17, height 5\' 6"  (1.676 m), weight 70.8 kg, SpO2 99 %.  PHYSICAL EXAMINATION:  GENERAL:  28 y.o.-year-old patient lying in the bed with no acute distress.  EYES: Pupils equal, round, reactive to light and accommodation. No scleral icterus. Extraocular muscles intact.  HEENT: Head atraumatic, normocephalic. Oropharynx and nasopharynx clear.  NECK:  Supple, no jugular venous distention. No thyroid enlargement, no tenderness.  LUNGS: Normal breath sounds bilaterally, no wheezing, rales,rhonchi or crepitation. No use of accessory muscles of respiration.  CARDIOVASCULAR: S1, S2 normal. No murmurs, rubs, or gallops.  ABDOMEN: Soft, nontender, nondistended. Bowel sounds present. No organomegaly or mass.  EXTREMITIES: Incision site for the left fifth metatarsal looks better as per picture taken by podiatry.  No wound dehiscence.  NEUROLOGIC: Cranial nerves II through  XII are intact. Muscle strength 5/5 in all extremities. Sensation intact. Gait not checked.  PSYCHIATRIC: The patient is alert and oriented x 3.  SKIN: No obvious rash, lesion, or ulcer.    LABORATORY PANEL:   CBC Recent Labs  Lab 03/05/19 0407  WBC 11.3*  HGB 12.9*  HCT 38.3*  PLT 209   ------------------------------------------------------------------------------------------------------------------  Chemistries  Recent Labs  Lab 03/04/19 2238 03/05/19 0407  03/07/19 0356  NA 139 139  --   --   K 4.1 4.1  --   --   CL 105 103  --   --   CO2 27 25  --   --   GLUCOSE 114* 105*  --   --   BUN 12 13  --   --   CREATININE 0.92 0.87   < > 0.89  CALCIUM 9.3 8.7*  --   --   AST 20  --   --   --   ALT 25  --   --   --   ALKPHOS 81  --   --   --   BILITOT 0.6  --   --   --    < > = values in this interval not displayed.   ------------------------------------------------------------------------------------------------------------------  Cardiac Enzymes No results for input(s): TROPONINI in the last 168 hours. ------------------------------------------------------------------------------------------------------------------  RADIOLOGY:  Dg Foot 2 Views Left  Result Date: 03/06/2019 CLINICAL DATA:  Left foot abscess.  Postoperative exam EXAM: LEFT FOOT - 2 VIEW COMPARISON:  03/05/2019 FINDINGS: There is no evidence of fracture or dislocation. There is no evidence of arthropathy or other focal bone abnormality. Postoperative changes to the lateral soft tissues at the  level of the distal fifth metatarsal and fifth proximal phalanx. No radiopaque foreign body. IMPRESSION: 1. No acute osseous abnormality. 2. Postoperative changes to the soft tissues of the lateral foot. Electronically Signed   By: Duanne Guess M.D.   On: 03/06/2019 14:02    EKG:  No orders found for this or any previous visit.  ASSESSMENT AND PLAN:   L left foot abscess at fifth metatarsophalangeal joint  status post I&D, continue vancomycin, incision dressings changed and Ace wrap applied by podiatry, podiatry recommends continuing IV antibiotics for another day, patient needs oral antibiotics for at least 7 to 10 days after discharge.  So far cultures are showing gram-positive cocci, final culture results are pending, partial weightbearing to nonweightbearing of the left leg all the time, physical therapy recommends rolling walker./  Discharge pending wound culture results. All the records are reviewed and case discussed with Care Management/Social Workerr. Management plans discussed with the patient, family and they are in agreement.  CODE STATUS: Full TOTAL TIME TAKING CARE OF THIS PATIENT: 38 minutes.   POSSIBLE D/C IN 1-2 DAYS, DEPENDING ON CLINICAL CONDITION.   Katha Hamming M.D on 03/07/2019 at 2:12 PM  Between 7am to 6pm - Pager - [phone]  After 6pm go to [email or web address] - [email or web address]  [facility]  Office  [phone]  CC: Primary care physician; Patient, No Pcp Per   Note: This dictation was prepared with Dragon dictation along with smaller phrase technology. Any transcriptional errors that result from this process are unintentional.

## 2019-03-07 NOTE — Discharge Instructions (Signed)
Podiatry discharge instructions: 1.  Keep surgical dressings to your left foot clean, dry, and intact.  Do not get these dressings wet.  If the dressings become saturated or loosened please call office for instructions.  Leave these dressings intact until your first postoperative visit. 2.  Call clinic to schedule an appointment within 1 week of your surgical date after discharge. 3.  Maintain partial weightbearing to your left foot at all times with heel contact only.  Try to stay off of your left foot as much as possible.  Staying off of the foot will decrease swelling, pain, and improve wound healing potential. 4.  Take antibiotics and pain medication as prescribed if prescribed.

## 2019-03-07 NOTE — TOC Initial Note (Signed)
Transition of Care [facility]) - Initial/Assessment Note    Patient Details  Name: Rickey [name] MRN: [id number] Date of Birth: June 20, 1990  Transition of Care [facility]) CM/SW Contact:    Su Hilt, RN Phone Number: 03/07/2019, 11:38 AM  Clinical Narrative:                  Met with the patient in the room to discuss DC plan The patient lives with a room mate and the room mates girlfriend He does not have a PCP I provided him with the [facility] and told him he needs to call to get an application appointment then they would set him up with an appointment with a physician if qualified and needed, he agrees He does not have insurance and would need assistance with medications thru med mgt at discharge. Will continue to monitor what those meds will be and send a fax to med mgt to obtain I notified Brad with [facility] of the need for a RW and BSC thru charity The patient stated that his room mate will provide transportation  Expected Discharge Plan: [place] Barriers to Discharge: Continued Medical Work up   Patient Goals and CMS Choice Patient states their goals for this hospitalization and ongoing recovery are:: go home CMS Medicare.gov Compare Post Acute Care list provided to:: Patient Choice offered to / list presented to : Patient  Expected Discharge Plan and Services Expected Discharge Plan: [place]   Discharge Planning Services: CM Consult   Living arrangements for the past 2 months: Single Family Home Expected Discharge Date: 03/07/19               DME Arranged: Berta Minor rolling DME Agency: [employer] Date DME Agency Contacted: 03/07/19 Time DME Agency Contacted: [phone] Representative spoke with at DME Agency: [place]: NA          Prior Living Arrangements/Services Living arrangements for the past 2 months: Single Family Home Lives with:: Roommate Patient language and need for interpreter reviewed::  Yes Do you feel safe going back to the place where you live?: Yes      Need for Family Participation in Patient Care: No (Comment) Care giver support system in place?: Yes (comment)   Criminal Activity/Legal Involvement Pertinent to Current Situation/Hospitalization: No - Comment as needed  Activities of Daily Living Home Assistive Devices/Equipment: None ADL Screening (condition at time of admission) Patient's cognitive ability adequate to safely complete daily activities?: Yes Is the patient deaf or have difficulty hearing?: No Does the patient have difficulty seeing, even when wearing glasses/contacts?: No Does the patient have difficulty concentrating, remembering, or making decisions?: No Patient able to express need for assistance with ADLs?: Yes Does the patient have difficulty dressing or bathing?: No Independently performs ADLs?: Yes (appropriate for developmental age) Does the patient have difficulty walking or climbing stairs?: No Weakness of Legs: None Weakness of Arms/Hands: None  Permission Sought/Granted   Permission granted to share information with : Yes, Verbal Permission Granted              Emotional Assessment Appearance:: Appears stated age Attitude/Demeanor/Rapport: Engaged Affect (typically observed): Appropriate Orientation: : Oriented to Self, Oriented to Place, Oriented to  Time, Oriented to Situation Alcohol / Substance Use: Not Applicable Psych Involvement: No (comment)  Admission diagnosis:  Cellulitis and abscess of toe of left foot [L03.032, L02.612] Patient Active Problem List   Diagnosis Date Noted  . Cellulitis of  left lower extremity 03/05/2019   PCP:  Patient, No Pcp Per Pharmacy:  No Pharmacies Listed    Social Determinants of Health (SDOH) Interventions    Readmission Risk Interventions No flowsheet data found.

## 2019-03-08 MED ORDER — DOXYCYCLINE HYCLATE 100 MG PO TABS: 100.0000 mg | ORAL_TABLET | Freq: Two times a day (BID) | ORAL | 0 refills | Status: AC

## 2019-03-08 MED ORDER — DOXYCYCLINE HYCLATE 100 MG PO TABS: 100.0000 mg | ORAL_TABLET | Freq: Two times a day (BID) | ORAL | Status: DC

## 2019-03-08 MED ORDER — HYDROCODONE-ACETAMINOPHEN 5-325 MG PO TABS: 1.0000 | ORAL_TABLET | Freq: Four times a day (QID) | ORAL | 0 refills | Status: AC | PRN

## 2019-03-08 MED ORDER — MUPIROCIN 2 % EX OINT: TOPICAL_OINTMENT | CUTANEOUS | 0 refills | Status: AC

## 2019-03-08 NOTE — Progress Notes (Addendum)
Pharmacy Antibiotic Note  Rickey [name] is a 28 y.o. male admitted on 03/04/2019 with cellulitis/worsening foot wound [place]  Pharmacy has been consulted for vancomycin dosing.  Plan: Continue Vancomycin 1250 mg IV Q 12 hrs. Goal AUC 400-550. Expected AUC: 501.6 SCr used: 0.89 Cssmin: 13.4  Today is day 4 and 7th dose at 1250mg  q12 schedule - Will check peak and trough after 1400 dose and adjust dosing if needed.  Addendum: Pt has been switched to doxycycline for discharge - will cancel scheduled Vancomycin labs  Height: 5\' 6"  (167.6 cm) Weight: 156 lb 1.4 oz (70.8 kg) IBW/kg (Calculated) : 63.8  Temp (24hrs), Avg:98.2 F (36.8 C), Min:97.7 F (36.5 C), Max:98.6 F (37 C)  Recent Labs  Lab 03/04/19 2238 03/05/19 0004 03/05/19 0407 03/06/19 0417 03/07/19 0356  WBC 10.7*  --  11.3*  --   --   CREATININE 0.92  --  0.87 1.00 0.89  LATICACIDVEN 1.3 1.0  --   --   --     Estimated Creatinine Clearance: 112.5 mL/min (by C-G formula based on SCr of 0.89 mg/dL).    No Known Allergies  Thank you for allowing pharmacy to be a part of this patient's care.  Lu Duffel, PharmD, BCPS Clinical Pharmacist 03/08/2019 7:39 AM

## 2019-03-08 NOTE — Discharge Summary (Signed)
Rickey McManuBarnet Benavidesy.o. male  DOB 07/25/1990  MRN [id number].  Admission date:  03/04/2019  Admitting Physician  Hannah Beat, MD  Discharge Date:  03/08/2019   Primary MD  Patient, No Pcp Per  Recommendations for primary care physician for things to follow:   Follow-up with Dr. Excell Seltzer from podiatry as scheduled.   Admission Diagnosis  Cellulitis and abscess of toe of left foot [L03.032, L02.612]   Discharge Diagnosis  Cellulitis and abscess of toe of left foot [L03.032, L02.612]   Active Problems:   Cellulitis of left lower extremity      Past Medical History:  Diagnosis Date  . Complication of anesthesia     Past Surgical History:  Procedure Laterality Date  . APPENDECTOMY    . CLAVICLE SURGERY    . HERNIA REPAIR    . INCISION AND DRAINAGE Left 03/06/2019   Procedure: INCISION AND DRAINAGE, LEFT FOOT;  Surgeon: Rosetta Posner, DPM;  Location: [facility] ORS;  Service: Podiatry;  Laterality: Left;       History of present illness and  Hospital Course:     Kindly see H&P for history of present illness and admission details, please review complete Labs, Consult reports and Test reports for all details in brief  HPI  from the history and physical done on the day of admission  28 year old male patient with no past medical history admitted for left foot pain.  Hospital Course  #1 left foot abscess at fifth metatarsal phalangeal joint status post I&D by podiatry, pus drained from that area, initially received vancomycin, Zosyn, Zosyn stopped after wound culture results showed staph aureus, patient continued on vancomycin, wound cultures are showing MRSA, sensitive to doxycycline, patient is afebrile now and wants to go home, dressings have been changed by podiatry, patient noted to have intact sutures, decreased  erythema, edema after the drainage of abscess, dressings are changed by podiatry yesterday, Ace wrap applied, patient advised to have nonweightbearing to partial weightbearing to left leg all the time, follow with podiatry as an outpatient, will discharge patient home with doxycycline 100 mg p.o. twice daily for 7 days.  Discharge instructions on the computer. Physical therapy recommends rolling walker.    Discharge Condition: Stable   Follow UP  Follow-up Information    Rosetta Posner, DPM. Go on 03/15/2019.   Specialty: Podiatry Why: @ 10:15 am Contact information: [address] [place] [address] [phone]             Discharge Instructions  and  Discharge Medications      Allergies as of 03/08/2019   No Known Allergies     Medication List    TAKE these medications   doxycycline 100 MG tablet Commonly known as: VIBRA-TABS Take 1 tablet (100 mg total) by mouth every 12 (twelve) hours for 10 days.   HYDROcodone-acetaminophen 5-325 MG tablet Commonly known as: NORCO/VICODIN Take 1-2 tablets by mouth every 6 (six) hours as needed for severe pain.   mupirocin ointment 2 % Commonly known as: BACTROBAN Apply to affected area 3 times daily         Diet and Activity recommendation: See Discharge Instructions above   Consults obtained -podiatry   Major procedures and Radiology Reports - PLEASE review detailed and final reports for all details, in brief -      Mr Foot Left W Wo Contrast  Result Date: 03/05/2019 CLINICAL DATA:  Pain, swelling and inflammation around the fifth digit. EXAM: MRI  OF THE LEFT FOREFOOT WITHOUT AND WITH CONTRAST TECHNIQUE: Multiplanar, multisequence MR imaging of the left foot was performed both before and after administration of intravenous contrast. CONTRAST:  7mL GADAVIST GADOBUTROL 1 MMOL/ML IV SOLN COMPARISON:  Radiographs 03/04/2019 FINDINGS: There is a focal skin blister along the lateral aspect of the forefoot at  the level of the fifth metatarsal head. Significant surrounding subcutaneous soft tissue swelling/edema/fluid consistent with cellulitis. This is mainly along the dorsum and lateral aspect of the foot. Just deep to the blister there is a 11 mm subcutaneous abscess. No MR findings to suggest septic arthritis or osteomyelitis. No myofasciitis or pyomyositis. IMPRESSION: 1. 11 mm subcutaneous abscess along the lateral aspect of the fifth metatarsal head with significant surrounding cellulitis extending into the dorsum of the foot. 2. No MR findings suspicious for septic arthritis or osteomyelitis. Electronically Signed   By: Rudie MeyerP.  Gallerani M.D.   On: 03/05/2019 09:11   Dg Foot 2 Views Left  Result Date: 03/06/2019 CLINICAL DATA:  Left foot abscess.  Postoperative exam EXAM: LEFT FOOT - 2 VIEW COMPARISON:  03/05/2019 FINDINGS: There is no evidence of fracture or dislocation. There is no evidence of arthropathy or other focal bone abnormality. Postoperative changes to the lateral soft tissues at the level of the distal fifth metatarsal and fifth proximal phalanx. No radiopaque foreign body. IMPRESSION: 1. No acute osseous abnormality. 2. Postoperative changes to the soft tissues of the lateral foot. Electronically Signed   By: Duanne GuessNicholas  Plundo M.D.   On: 03/06/2019 14:02   Dg Foot Complete Left  Result Date: 03/04/2019 CLINICAL DATA:  Foot swelling, redness and area of necrosis EXAM: LEFT FOOT - COMPLETE 3+ VIEW COMPARISON:  None. FINDINGS: Focal soft tissue thickening is noted along the lateral aspect of the fifth metatarsal head. No radiographically evident ulceration. No subjacent osseous erosion, periostitis or destructive change to suggest early radiographic features of osteomyelitis. No subcutaneous gas or foreign body. No acute fracture or traumatic malalignment. Corticated os trigonum is noted posteriorly IMPRESSION: Focal soft tissue thickening along the lateral aspect of the fifth metatarsal head. No  radiographic evidence of osteomyelitis. No subcutaneous gas or foreign body. Electronically Signed   By: Kreg ShropshirePrice  DeHay M.D.   On: 03/04/2019 23:12    Micro Results     Recent Results (from the past 240 hour(s))  Blood culture (routine x 2)     Status: None (Preliminary result)   Collection Time: 03/04/19 10:47 PM   Specimen: BLOOD  Result Value Ref Range Status   Specimen Description BLOOD RIGHT ANTECUBITAL  Final   Special Requests   Final    BOTTLES DRAWN AEROBIC AND ANAEROBIC Blood Culture results may not be optimal due to an inadequate volume of blood received in culture bottles   Culture   Final    NO GROWTH 4 DAYS Performed at [facility], [address] [place], [place] [address]    Report Status PENDING  Incomplete  Blood culture (routine x 2)     Status: None (Preliminary result)   Collection Time: 03/04/19 10:47 PM   Specimen: BLOOD  Result Value Ref Range Status   Specimen Description BLOOD LEFT FOREARM  Final   Special Requests   Final    BOTTLES DRAWN AEROBIC AND ANAEROBIC Blood Culture adequate volume   Culture   Final    NO GROWTH 4 DAYS Performed at [facility], [address] [place], [place] [address]    Report Status PENDING  Incomplete  SARS Coronavirus 2 [facility] order, Performed in [facility] hospital lab) Nasopharyngeal Nasopharyngeal Swab     Status: None   Collection Time: 03/05/19 12:55 AM   Specimen: Nasopharyngeal Swab  Result Value Ref Range Status   SARS Coronavirus 2 NEGATIVE NEGATIVE Final    Comment: (NOTE) If result is NEGATIVE SARS-CoV-2 target nucleic acids are NOT DETECTED. The SARS-CoV-2 RNA is generally detectable in upper and lower  respiratory specimens during the acute phase of infection. The lowest  concentration of SARS-CoV-2 viral copies this assay can detect is 250  copies / mL. A negative result does not preclude SARS-CoV-2 infection  and should not be used as the sole basis for treatment or other   patient management decisions.  A negative result may occur with  improper specimen collection / handling, submission of specimen other  than nasopharyngeal swab, presence of viral mutation(s) within the  areas targeted by this assay, and inadequate number of viral copies  (<250 copies / mL). A negative result must be combined with clinical  observations, patient history, and epidemiological information. If result is POSITIVE SARS-CoV-2 target nucleic acids are DETECTED. The SARS-CoV-2 RNA is generally detectable in upper and lower  respiratory specimens dur ing the acute phase of infection.  Positive  results are indicative of active infection with SARS-CoV-2.  Clinical  correlation with patient history and other diagnostic information is  necessary to determine patient infection status.  Positive results do  not rule out bacterial infection or co-infection with other viruses. If result is PRESUMPTIVE POSTIVE SARS-CoV-2 nucleic acids MAY BE PRESENT.   A presumptive positive result was obtained on the submitted specimen  and confirmed on repeat testing.  While 2019 novel coronavirus  (SARS-CoV-2) nucleic acids may be present in the submitted sample  additional confirmatory testing may be necessary for epidemiological  and / or clinical management purposes  to differentiate between  SARS-CoV-2 and other Sarbecovirus currently known to infect humans.  If clinically indicated additional testing with an alternate test  methodology [phone]) is advised. The SARS-CoV-2 RNA is generally  detectable in upper and lower respiratory sp ecimens during the acute  phase of infection. The expected result is Negative. Fact Sheet for Patients:  StrictlyIdeas.no Fact Sheet for Healthcare Providers: BankingDealers.co.za This test is not yet approved or cleared by the [place] FDA and has been authorized for detection and/or diagnosis of SARS-CoV-2  by FDA under an Emergency Use Authorization (EUA).  This EUA will remain in effect (meaning this test can be used) for the duration of the COVID-19 declaration under Section 564(b)(1) of the Act, 21 U.S.C. section 360bbb-3(b)(1), unless the authorization is terminated or revoked sooner. Performed at [facility], [address] [place], [place] [address]   Aerobic/Anaerobic Culture (surgical/deep wound)     Status: None (Preliminary result)   Collection Time: 03/05/19 10:15 AM   Specimen: Wound; Abscess  Result Value Ref Range Status   Specimen Description   Final    WOUND Performed at [facility], [address], [place], [place] [address]    Special Requests   Final    Normal Performed at [facility], [address] [place], [place] [address]    Gram Stain   Final    NO WBC SEEN FEW GRAM POSITIVE COCCI Performed at [facility], [place] [address], [place], [place] [address]    Culture   Final    MODERATE METHICILLIN RESISTANT STAPHYLOCOCCUS AUREUS NO ANAEROBES ISOLATED; CULTURE IN  PROGRESS FOR 5 DAYS    Report Status PENDING  Incomplete   Organism ID, Bacteria METHICILLIN RESISTANT STAPHYLOCOCCUS AUREUS  Final      Susceptibility   Methicillin resistant staphylococcus aureus - MIC*    CIPROFLOXACIN >=8 RESISTANT Resistant     ERYTHROMYCIN >=8 RESISTANT Resistant     GENTAMICIN <=0.5 SENSITIVE Sensitive     OXACILLIN >=4 RESISTANT Resistant     TETRACYCLINE <=1 SENSITIVE Sensitive     VANCOMYCIN <=0.5 SENSITIVE Sensitive     TRIMETH/SULFA >=320 RESISTANT Resistant     CLINDAMYCIN >=8 RESISTANT Resistant     RIFAMPIN <=0.5 SENSITIVE Sensitive     Inducible Clindamycin NEGATIVE Sensitive     * MODERATE METHICILLIN RESISTANT STAPHYLOCOCCUS AUREUS  Aerobic/Anaerobic Culture (surgical/deep wound)     Status: None (Preliminary result)   Collection Time: 03/06/19 12:01 PM   Specimen: PATH Other; Abscess  Result Value Ref Range Status    Specimen Description   Final    FOOT LEFT MID METATARSAL ABSCESS Performed at [facility], [address] [place], [place] [address]    Special Requests   Final    NONE Performed at [facility], [address] [place], [place] [address]    Gram Stain   Final    ABUNDANT WBC PRESENT, PREDOMINANTLY PMN FEW GRAM POSITIVE COCCI Performed at [facility], [address] [place], [place] [address]    Culture   Final    FEW METHICILLIN RESISTANT STAPHYLOCOCCUS AUREUS NO ANAEROBES ISOLATED; CULTURE IN PROGRESS FOR 5 DAYS    Report Status PENDING  Incomplete   Organism ID, Bacteria METHICILLIN RESISTANT STAPHYLOCOCCUS AUREUS  Final      Susceptibility   Methicillin resistant staphylococcus aureus - MIC*    CIPROFLOXACIN >=8 RESISTANT Resistant     ERYTHROMYCIN >=8 RESISTANT Resistant     GENTAMICIN <=0.5 SENSITIVE Sensitive     OXACILLIN >=4 RESISTANT Resistant     TETRACYCLINE <=1 SENSITIVE Sensitive     VANCOMYCIN <=0.5 SENSITIVE Sensitive     TRIMETH/SULFA 160 RESISTANT Resistant     CLINDAMYCIN >=8 RESISTANT Resistant     RIFAMPIN <=0.5 SENSITIVE Sensitive     Inducible Clindamycin NEGATIVE Sensitive     * FEW METHICILLIN RESISTANT STAPHYLOCOCCUS AUREUS       Today   Subjective:   Rickey [name] today has no headache,no chest abdominal pain,no new weakness tingling or numbness, feels much better wants to go home today.  Objective:   Blood pressure 131/76, pulse 63, temperature 98.4 F (36.9 C), resp. rate 18, height  (1.676 m), weight 70.8 kg, SpO2 98 %.   Intake/Output Summary (Last 24 hours) at 03/08/2019 1404 Last data filed at 03/08/2019 0433 Gross per 24 hour  Intake 250 ml  Output 2510 ml  Net -2260 ml    Exam Awake Alert, Oriented x 3, No new F.N deficits, Normal affect [place].AT,PERRAL Supple Neck,No JVD, No cervical lymphadenopathy appriciated.  Symmetrical Chest wall movement, Good air movement bilaterally, CTAB RRR,No  Gallops,Rubs or new Murmurs, No Parasternal Heave +ve B.Sounds, Abd Soft, Non tender, No organomegaly appriciated, No rebound -guarding or rigidity. dressing present for the left foot.  Continue with dressing for the left foot till seen by podiatry.  Discussed with Dr. Excell Seltzer today.    Data Review   CBC w Diff:  Lab Results  Component Value Date   WBC 11.3 (H) 03/05/2019   HGB 12.9 (L) 03/05/2019   HCT 38.3 (L) 03/05/2019  PLT 209 03/05/2019   LYMPHOPCT 16 03/04/2019   MONOPCT 9 03/04/2019   EOSPCT 2 03/04/2019   BASOPCT 1 03/04/2019    CMP:  Lab Results  Component Value Date   NA 139 03/05/2019   K 4.1 03/05/2019   CL 103 03/05/2019   CO2 25 03/05/2019   BUN 13 03/05/2019   CREATININE 0.89 03/07/2019   PROT 7.2 03/04/2019   ALBUMIN 3.8 03/04/2019   BILITOT 0.6 03/04/2019   ALKPHOS 81 03/04/2019   AST 20 03/04/2019   ALT 25 03/04/2019  .   Total Time in preparing paper work, data evaluation and todays exam - 35 minutes  Katha Hamming M.D on 03/08/2019 at 2:04 PM    Note: This dictation was prepared with Dragon dictation along with smaller phrase technology. Any transcriptional errors that result from this process are unintentional.

## 2019-03-08 NOTE — TOC Transition Note (Signed)
Transition of Care [facility]) - CM/SW Discharge Note   Patient Details  Name: Rickey [name] MRN: [id number] Date of Birth: 1991-04-19  Transition of Care [facility]) CM/SW Contact:  Jaecob Lowden, Lenice Llamas Phone Number: [phone]  03/08/2019, 2:12 PM   Clinical Narrative: Clinical Social Worker (CSW) met with patient alone at bedside to discuss D/C plan. Patient was alert and oriented X4 and was sitting up in the bed. Rolling walker and bedside commode have been delivered. Patient reported that he is eager to D/C home today. Patient reported that he was at [facility] and was traveling back home to [place] when he stopped in [place] to stay with roommates. Patient reported he plans on leaving [facility] today and staying a few more days in [place]. Per patient his brother is coming down to [place] in a few days and will transport him back to [place]. Patient reported that he has no insurance and needs assistance with medications. CSW made patient aware that medication management clinic across the street from [facility] [place] can provide him with bactroban and doxycycline. CSW provided patient medication management clinic address. CSW explained that patient can get norco at [place] for $15 to $20. Patient verbalized his understanding and thanked CSW for assistance. Patient reported he will have his ride pick him up from [facility] today and take him to medication management clinic. CSW faxed bactroban and doxycyline scripts to medication management and confirmed they will fill the medications. MD gave verbal order for bactroban cream instead of the ointment because medication management clinic has the cream. RN aware of above. Please reconsult if future social work needs arise. CSW signing off.     Final next level of care: Home/Self Care Barriers to Discharge: Barriers Resolved   Patient Goals and CMS Choice Patient states their goals for this hospitalization and ongoing recovery are:: To go home. CMS  Medicare.gov Compare Post Acute Care list provided to:: Patient Choice offered to / list presented to : Patient  Discharge Placement                       Discharge Plan and Services   Discharge Planning Services: Medication Assistance, Other - See comment([facility])            DME Arranged: Walker rolling, 3-N-1 DME Agency: [employer] Date DME Agency Contacted: 03/07/19 Time DME Agency Contacted: 2130 Representative spoke with at DME Agency: [place]: NA          Social Determinants of Health ([place]) Interventions     Readmission Risk Interventions No flowsheet data found.

## 2019-03-08 NOTE — Progress Notes (Signed)
Discharge summary reviewed with verbal understanding. Specific instructions to pick up meds and follow up appt.

## 2019-03-08 NOTE — Progress Notes (Signed)
Physical Therapy Treatment Patient Details Name: Rickey [name] MRN: [id number] DOB: Jun 01, 1991 Today's Date: 03/08/2019    History of Present Illness Pt is a 28 yo male diagnosed with left foot abscess and cellulitis and is s/p left foot I&D to the level of subcutaneous tissue.   Pt is a current smoker.    PT Comments    Pt presented with min deficits in gait but performed very well during the session.  Pt was able to amb with a RW with hop-to gait 2 x 30' with very good control and stability.  Pt was able to ascend and descend 2 stairs with a RW with excellent stability and eccentric/concentric control.  Pt was able to maintain LLE NWB status throughout the session.  No skilled PT needs upon discharge from acute care.      Follow Up Recommendations  No PT follow up     Equipment Recommendations  Rolling walker with 5" wheels;3in1 (PT)    Recommendations for Other Services       Precautions / Restrictions Precautions Precautions: None Required Braces or Orthoses: Other Brace Other Brace: Heel wedge post-op shoe to the L foot with mobility Restrictions Weight Bearing Restrictions: Yes LLE Weight Bearing: Non weight bearing Other Position/Activity Restrictions: Per surgeon, limit amb to functional distances only. OK for pt to put left heel down with wedge shoe donned as needed during transfers and ambulation but attempt to stay NWB.    Mobility  Bed Mobility Overal bed mobility: Independent                Transfers Overall transfer level: Independent   Transfers: Sit to/from Stand Sit to Stand: Independent         General transfer comment: Good eccentric and concentric control and LLE compliance during sit to/from stand transfer training from various height surfaces.  Ambulation/Gait Ambulation/Gait assistance: Supervision Gait Distance (Feet): 30 Feet x 2 Assistive device: Rolling walker (2 wheeled)   Gait velocity: decreased   General Gait Details: Hop-to  gait with good sequencing with with LLE NWB compliance maintained throughout the session.   Stairs Stairs: Yes Stairs assistance: Supervision Stair Management: With crutches;Backwards;Forwards Number of Stairs: 2 General stair comments: Ascend/descend 2 steps x 2 with a RW with min verbal cues as well as visual demonstration for proper sequencing.  Good control and stability ascending backwards and descending forwards with LLE NWB compliance maintained throughout.   Wheelchair Mobility    Modified Rankin (Stroke Patients Only)       Balance Overall balance assessment: No apparent balance deficits (not formally assessed)                                          Cognition Arousal/Alertness: Awake/alert Behavior During Therapy: WFL for tasks assessed/performed Overall Cognitive Status: Within Functional Limits for tasks assessed                                        Exercises Other Exercises Other Exercises: Pt education on proper use and setup of BSC and RW    General Comments        Pertinent Vitals/Pain Pain Assessment: No/denies pain    Home Living  Prior Function            PT Goals (current goals can now be found in the care plan section) Progress towards PT goals: Progressing toward goals    Frequency    Min 2X/week      PT Plan Current plan remains appropriate    Co-evaluation              AM-PAC PT "6 Clicks" Mobility   Outcome Measure  Help needed turning from your back to your side while in a flat bed without using bedrails?: None Help needed moving from lying on your back to sitting on the side of a flat bed without using bedrails?: None Help needed moving to and from a bed to a chair (including a wheelchair)?: None Help needed standing up from a chair using your arms (e.g., wheelchair or bedside chair)?: None Help needed to walk in hospital room?: A Little Help needed  climbing 3-5 steps with a railing? : A Little 6 Click Score: 22    End of Session Equipment Utilized During Treatment: Gait belt Activity Tolerance: Patient tolerated treatment well Patient left: in chair;with call bell/phone within reach Nurse Communication: Mobility status PT Visit Diagnosis: Difficulty in walking, not elsewhere classified (R26.2)     Time: 1053-1110 PT Time Calculation (min) (ACUTE ONLY): 17 min  Charges:  $Gait Training: 8-22 mins                     D. Scott Oreoluwa Aigner PT, DPT 03/08/19, 12:27 PM

## 2019-03-09 LAB — CULTURE, BLOOD (ROUTINE X 2)
Culture: NO GROWTH
Culture: NO GROWTH
Special Requests: ADEQUATE

## 2019-03-10 LAB — AEROBIC/ANAEROBIC CULTURE W GRAM STAIN (SURGICAL/DEEP WOUND)
Gram Stain: NONE SEEN
Special Requests: NORMAL

## 2020-06-29 IMAGING — MR MR FOOT*L* WO/W CM
9 series · 40 of 40 positions shown · IV contrast (gadavist)
Comparison: Radiographs 03/04/2019

CLINICAL DATA: Pain, swelling and inflammation around the fifth
digit.

EXAM:
MRI OF THE LEFT FOREFOOT WITHOUT AND WITH CONTRAST
TECHNIQUE: Multiplanar, multisequence MR imaging of the left foot was performed
both before and after administration of intravenous contrast.
CONTRAST:  7mL GADAVIST GADOBUTROL 1 MMOL/ML IV SOLN

[Series 7: T1 · oblique · left · 3.0mm · 0.70mm/px · 3 of 17 slices shown (1 of 2)]
[im 1/17]
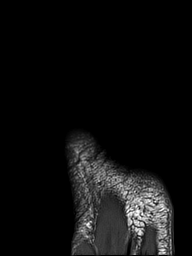
[im 9/17]
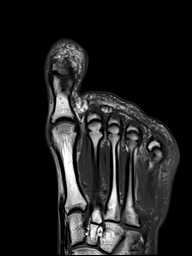
[im 17/17]
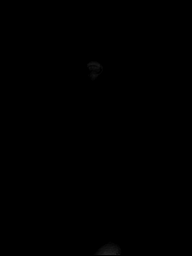

[Series 9: T2 · oblique · left · 3.0mm · 0.70mm/px · 3 of 17 slices shown (1 of 2)]
[im 1/17]
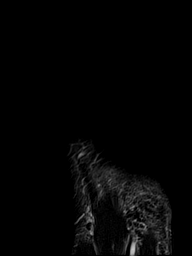
[im 9/17]
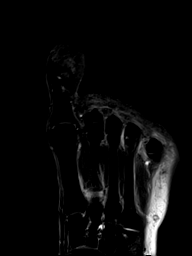
[im 17/17]
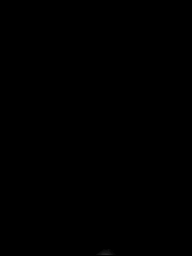

[Series 10: STIR · sagittal · left · 3.0mm · 0.62mm/px · 4 of 30 slices shown]
[im 1/30]
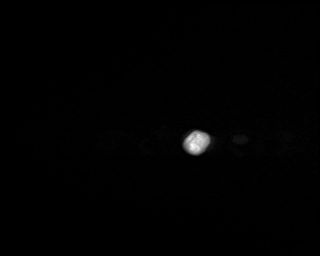
[im 10/30]
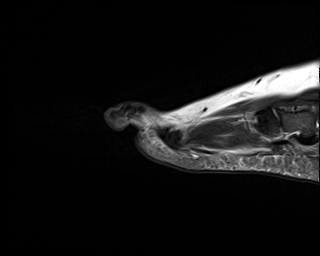
[im 20/30]
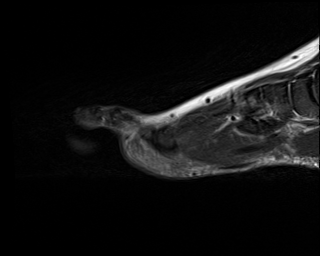
[im 30/30]
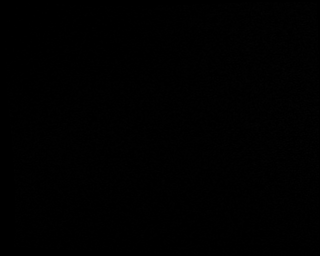

[Series 12: T1 fat-sat · coronal · non-contrast · left · 3.0mm · 0.47mm/px · 6 of 45 slices shown]
[im 1/45]
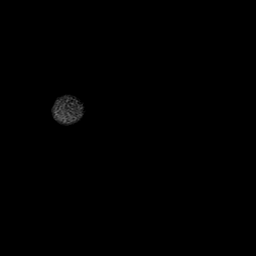
[im 9/45]
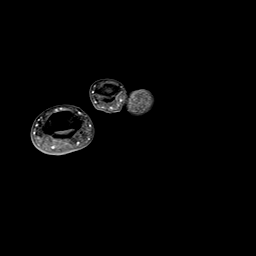
[im 18/45]
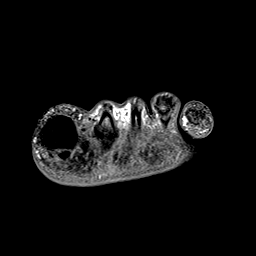
[im 27/45]
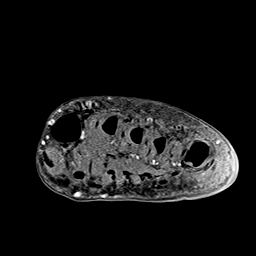
[im 36/45]
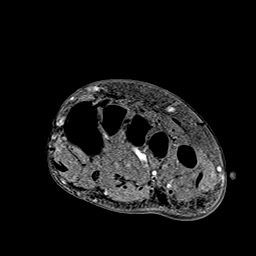
[im 45/45]
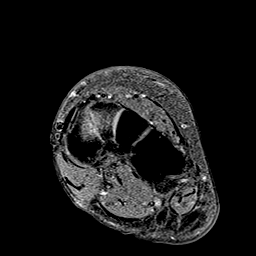

[Series 13: T1 · coronal · left · 3.0mm · 0.38mm/px · 6 of 45 slices shown (2 of 2)]
[im 1/45]
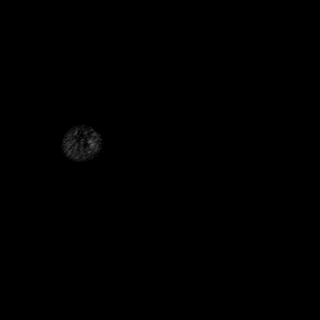
[im 9/45]
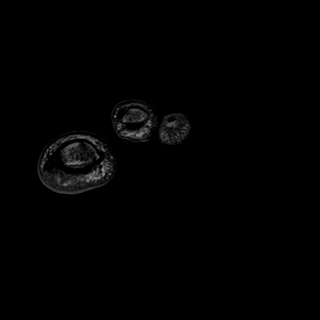
[im 18/45]
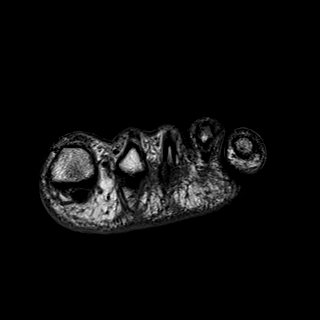
[im 27/45]
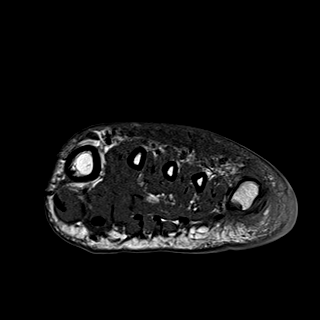
[im 36/45]
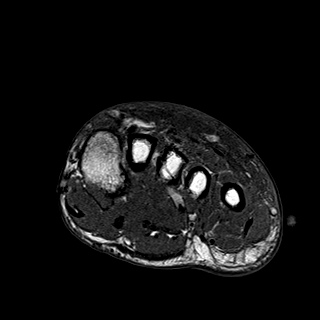
[im 45/45]
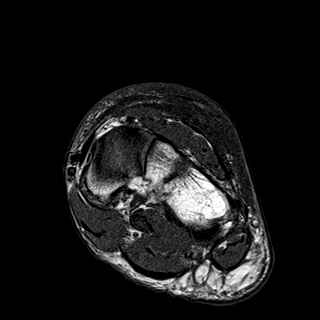

[Series 15: T2 · coronal · left · 3.0mm · 0.50mm/px · 6 of 45 slices shown (2 of 2)]
[im 1/45]
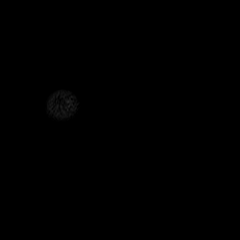
[im 9/45]
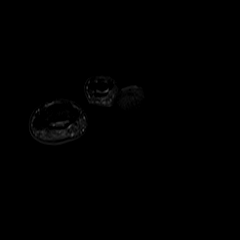
[im 18/45]
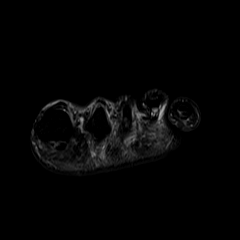
[im 27/45]
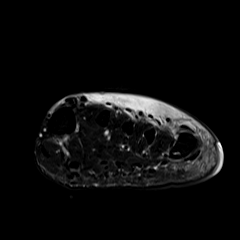
[im 36/45]
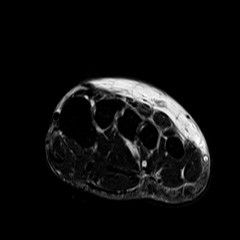
[im 45/45]
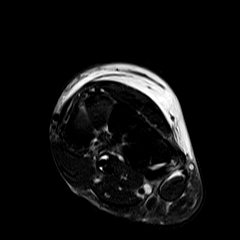

[Series 17: T1 fat-sat post-contrast · sagittal · left · 3.0mm · 0.62mm/px · 4 of 32 slices shown (1 of 3)]
[im 1/32]
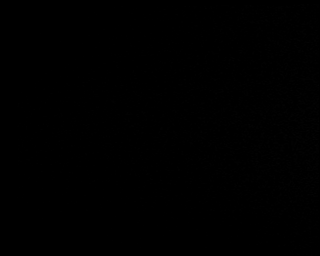
[im 11/32]
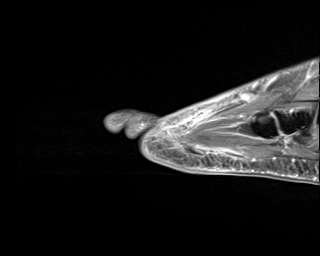
[im 21/32]
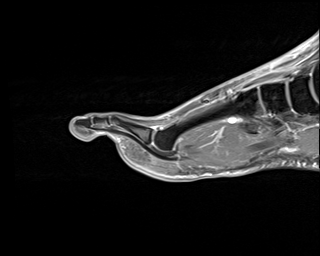
[im 32/32]
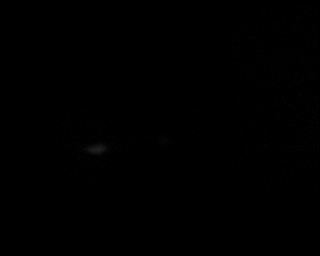

[Series 18: T1 fat-sat post-contrast · oblique · left · 3.0mm · 0.56mm/px · 2 of 18 slices shown (2 of 3)]
[im 1/18]
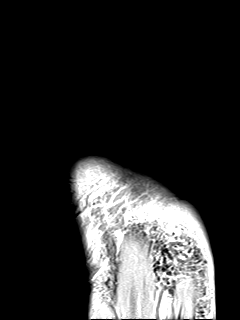
[im 18/18]
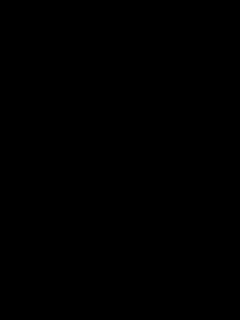

[Series 19: T1 fat-sat post-contrast · coronal · left · 3.0mm · 0.47mm/px · 6 of 45 slices shown (3 of 3)]
[im 1/45]
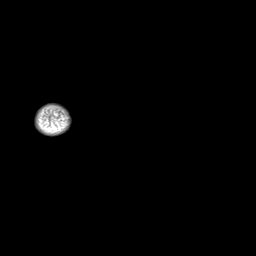
[im 9/45]
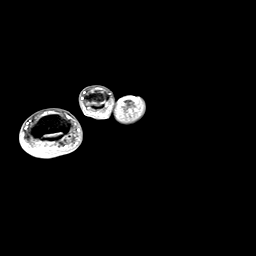
[im 18/45]
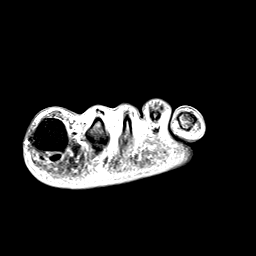
[im 27/45]
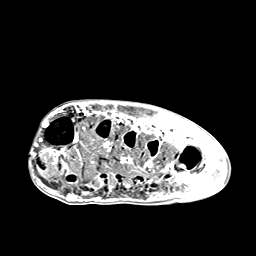
[im 36/45]
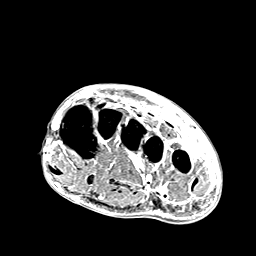
[im 45/45]
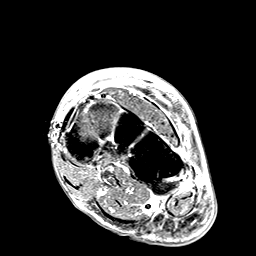

[40 of 40 positions shown; findings below may reference images not displayed]

FINDINGS: There is a focal skin blister along the lateral aspect of the
forefoot at the level of the fifth metatarsal head. Significant
surrounding subcutaneous soft tissue swelling/edema/fluid consistent
with cellulitis. This is mainly along the dorsum and lateral aspect
of the foot.

Just deep to the blister there is a 11 mm subcutaneous abscess.

No MR findings to suggest septic arthritis or osteomyelitis. No
myofasciitis or pyomyositis.
IMPRESSION: 1. 11 mm subcutaneous abscess along the lateral aspect of the fifth
metatarsal head with significant surrounding cellulitis extending
into the dorsum of the foot.
2. No MR findings suspicious for septic arthritis or osteomyelitis.
